# Patient Record
Sex: Female | Born: 1975 | Race: Black or African American | Hispanic: No | Smoking: Current every day smoker
Health system: Southern US, Community
[De-identification: ages and names within clinical notes are randomized; demographics above are authoritative.]

## PROBLEM LIST (undated history)

## (undated) ENCOUNTER — Inpatient Hospital Stay (HOSPITAL_COMMUNITY): Payer: Self-pay

## (undated) ENCOUNTER — Ambulatory Visit (HOSPITAL_COMMUNITY): Disposition: A | Discharge status: Home / Self Care | Payer: Medicaid Other

## (undated) DIAGNOSIS — I10 Essential (primary) hypertension: Secondary | ICD-10-CM

## (undated) DIAGNOSIS — F32A Depression, unspecified: Secondary | ICD-10-CM

## (undated) DIAGNOSIS — K219 Gastro-esophageal reflux disease without esophagitis: Secondary | ICD-10-CM

## (undated) DIAGNOSIS — N751 Abscess of Bartholin's gland: Secondary | ICD-10-CM

## (undated) DIAGNOSIS — O24419 Gestational diabetes mellitus in pregnancy, unspecified control: Secondary | ICD-10-CM

## (undated) DIAGNOSIS — J45909 Unspecified asthma, uncomplicated: Secondary | ICD-10-CM

## (undated) DIAGNOSIS — F329 Major depressive disorder, single episode, unspecified: Secondary | ICD-10-CM

## (undated) HISTORY — DX: Major depressive disorder, single episode, unspecified: F32.9

## (undated) HISTORY — DX: Depression, unspecified: F32.A

## (undated) HISTORY — DX: Gestational diabetes mellitus in pregnancy, unspecified control: O24.419

## (undated) HISTORY — DX: Abscess of Bartholin's gland: N75.1

## (undated) HISTORY — DX: Gastro-esophageal reflux disease without esophagitis: K21.9

## (undated) HISTORY — PX: WISDOM TOOTH EXTRACTION: SHX21

## (undated) HISTORY — DX: Unspecified asthma, uncomplicated: J45.909

---

## 1997-07-28 ENCOUNTER — Emergency Department (HOSPITAL_COMMUNITY): Admission: EM | Admit: 1997-07-28 | Discharge: 1997-07-28 | Payer: Self-pay | Admitting: Emergency Medicine

## 1997-10-28 ENCOUNTER — Encounter: Admission: RE | Admit: 1997-10-28 | Discharge: 1997-10-28 | Payer: Self-pay | Admitting: Family Medicine

## 1997-12-29 ENCOUNTER — Emergency Department (HOSPITAL_COMMUNITY): Admission: EM | Admit: 1997-12-29 | Discharge: 1997-12-29 | Payer: Self-pay | Admitting: *Deleted

## 1998-03-10 ENCOUNTER — Encounter: Admission: RE | Admit: 1998-03-10 | Discharge: 1998-03-10 | Payer: Self-pay | Admitting: Family Medicine

## 1998-04-17 ENCOUNTER — Encounter: Admission: RE | Admit: 1998-04-17 | Discharge: 1998-04-17 | Payer: Self-pay | Admitting: Sports Medicine

## 1998-04-26 ENCOUNTER — Encounter: Admission: RE | Admit: 1998-04-26 | Discharge: 1998-04-26 | Payer: Self-pay | Admitting: Family Medicine

## 1998-07-13 ENCOUNTER — Emergency Department (HOSPITAL_COMMUNITY): Admission: EM | Admit: 1998-07-13 | Discharge: 1998-07-13 | Payer: Self-pay | Admitting: Emergency Medicine

## 1998-07-29 ENCOUNTER — Emergency Department (HOSPITAL_COMMUNITY): Admission: EM | Admit: 1998-07-29 | Discharge: 1998-07-29 | Payer: Self-pay | Admitting: Emergency Medicine

## 1998-09-05 ENCOUNTER — Encounter: Payer: Self-pay | Admitting: Emergency Medicine

## 1998-09-05 ENCOUNTER — Emergency Department (HOSPITAL_COMMUNITY): Admission: EM | Admit: 1998-09-05 | Discharge: 1998-09-05 | Payer: Self-pay | Admitting: Emergency Medicine

## 1998-11-10 ENCOUNTER — Emergency Department (HOSPITAL_COMMUNITY): Admission: EM | Admit: 1998-11-10 | Discharge: 1998-11-10 | Payer: Self-pay | Admitting: Emergency Medicine

## 1999-03-01 ENCOUNTER — Encounter: Admission: RE | Admit: 1999-03-01 | Discharge: 1999-03-01 | Payer: Self-pay | Admitting: Family Medicine

## 1999-03-21 ENCOUNTER — Encounter: Admission: RE | Admit: 1999-03-21 | Discharge: 1999-03-21 | Payer: Self-pay | Admitting: Family Medicine

## 1999-04-04 ENCOUNTER — Encounter: Payer: Self-pay | Admitting: Emergency Medicine

## 1999-04-04 ENCOUNTER — Encounter: Admission: RE | Admit: 1999-04-04 | Discharge: 1999-04-04 | Payer: Self-pay | Admitting: Family Medicine

## 1999-04-04 ENCOUNTER — Emergency Department (HOSPITAL_COMMUNITY): Admission: EM | Admit: 1999-04-04 | Discharge: 1999-04-04 | Payer: Self-pay | Admitting: Emergency Medicine

## 1999-04-06 ENCOUNTER — Emergency Department (HOSPITAL_COMMUNITY): Admission: EM | Admit: 1999-04-06 | Discharge: 1999-04-06 | Payer: Self-pay | Admitting: Emergency Medicine

## 1999-04-11 ENCOUNTER — Encounter: Admission: RE | Admit: 1999-04-11 | Discharge: 1999-04-11 | Payer: Self-pay | Admitting: Family Medicine

## 1999-04-16 ENCOUNTER — Inpatient Hospital Stay (HOSPITAL_COMMUNITY): Admission: EM | Admit: 1999-04-16 | Discharge: 1999-04-19 | Payer: Self-pay | Admitting: Emergency Medicine

## 1999-05-23 ENCOUNTER — Encounter: Admission: RE | Admit: 1999-05-23 | Discharge: 1999-05-23 | Payer: Self-pay | Admitting: Family Medicine

## 1999-07-13 ENCOUNTER — Encounter: Payer: Self-pay | Admitting: Emergency Medicine

## 1999-07-13 ENCOUNTER — Inpatient Hospital Stay (HOSPITAL_COMMUNITY): Admission: AD | Admit: 1999-07-13 | Discharge: 1999-07-16 | Payer: Self-pay | Admitting: Obstetrics & Gynecology

## 1999-08-21 ENCOUNTER — Encounter: Admission: RE | Admit: 1999-08-21 | Discharge: 1999-08-21 | Payer: Self-pay | Admitting: Obstetrics & Gynecology

## 1999-08-21 ENCOUNTER — Other Ambulatory Visit: Admission: RE | Admit: 1999-08-21 | Discharge: 1999-08-21 | Payer: Self-pay | Admitting: Obstetrics & Gynecology

## 1999-09-27 ENCOUNTER — Encounter: Admission: RE | Admit: 1999-09-27 | Discharge: 1999-09-27 | Payer: Self-pay | Admitting: Family Medicine

## 1999-11-26 ENCOUNTER — Encounter: Admission: RE | Admit: 1999-11-26 | Discharge: 1999-11-26 | Payer: Self-pay | Admitting: Family Medicine

## 1999-12-28 ENCOUNTER — Encounter: Admission: RE | Admit: 1999-12-28 | Discharge: 1999-12-28 | Payer: Self-pay | Admitting: Family Medicine

## 2000-04-11 ENCOUNTER — Encounter: Admission: RE | Admit: 2000-04-11 | Discharge: 2000-04-11 | Payer: Self-pay | Admitting: Family Medicine

## 2000-04-14 ENCOUNTER — Encounter: Admission: RE | Admit: 2000-04-14 | Discharge: 2000-04-14 | Payer: Self-pay | Admitting: Family Medicine

## 2000-04-25 ENCOUNTER — Encounter: Admission: RE | Admit: 2000-04-25 | Discharge: 2000-04-25 | Payer: Self-pay | Admitting: Family Medicine

## 2000-09-17 ENCOUNTER — Encounter: Admission: RE | Admit: 2000-09-17 | Discharge: 2000-09-17 | Payer: Self-pay | Admitting: Family Medicine

## 2000-10-08 ENCOUNTER — Encounter: Admission: RE | Admit: 2000-10-08 | Discharge: 2000-10-08 | Payer: Self-pay | Admitting: Family Medicine

## 2001-02-20 ENCOUNTER — Other Ambulatory Visit: Admission: RE | Admit: 2001-02-20 | Discharge: 2001-02-20 | Payer: Self-pay | Admitting: Family Medicine

## 2001-02-20 ENCOUNTER — Encounter: Admission: RE | Admit: 2001-02-20 | Discharge: 2001-02-20 | Payer: Self-pay | Admitting: Family Medicine

## 2001-06-02 ENCOUNTER — Encounter: Admission: RE | Admit: 2001-06-02 | Discharge: 2001-06-02 | Payer: Self-pay | Admitting: Family Medicine

## 2001-06-10 ENCOUNTER — Encounter: Admission: RE | Admit: 2001-06-10 | Discharge: 2001-06-10 | Payer: Self-pay | Admitting: Family Medicine

## 2001-06-18 ENCOUNTER — Encounter: Admission: RE | Admit: 2001-06-18 | Discharge: 2001-06-18 | Payer: Self-pay | Admitting: Family Medicine

## 2001-06-30 ENCOUNTER — Encounter: Admission: RE | Admit: 2001-06-30 | Discharge: 2001-06-30 | Payer: Self-pay | Admitting: Family Medicine

## 2001-07-03 ENCOUNTER — Encounter: Admission: RE | Admit: 2001-07-03 | Discharge: 2001-07-03 | Payer: Self-pay | Admitting: Family Medicine

## 2001-07-29 ENCOUNTER — Encounter: Admission: RE | Admit: 2001-07-29 | Discharge: 2001-07-29 | Payer: Self-pay | Admitting: Family Medicine

## 2001-08-24 ENCOUNTER — Emergency Department (HOSPITAL_COMMUNITY): Admission: EM | Admit: 2001-08-24 | Discharge: 2001-08-24 | Payer: Self-pay

## 2001-09-10 ENCOUNTER — Encounter: Admission: RE | Admit: 2001-09-10 | Discharge: 2001-09-10 | Payer: Self-pay | Admitting: Family Medicine

## 2001-09-15 ENCOUNTER — Encounter: Admission: RE | Admit: 2001-09-15 | Discharge: 2001-09-15 | Payer: Self-pay | Admitting: Family Medicine

## 2002-08-10 ENCOUNTER — Encounter: Admission: RE | Admit: 2002-08-10 | Discharge: 2002-08-10 | Payer: Self-pay | Admitting: Sports Medicine

## 2002-11-03 ENCOUNTER — Encounter: Admission: RE | Admit: 2002-11-03 | Discharge: 2002-11-03 | Payer: Self-pay | Admitting: Family Medicine

## 2002-11-04 ENCOUNTER — Encounter: Payer: Self-pay | Admitting: Emergency Medicine

## 2002-11-04 ENCOUNTER — Emergency Department (HOSPITAL_COMMUNITY): Admission: EM | Admit: 2002-11-04 | Discharge: 2002-11-04 | Payer: Self-pay | Admitting: Emergency Medicine

## 2003-01-06 ENCOUNTER — Other Ambulatory Visit: Admission: RE | Admit: 2003-01-06 | Discharge: 2003-01-06 | Payer: Self-pay | Admitting: Family Medicine

## 2003-01-06 ENCOUNTER — Encounter: Admission: RE | Admit: 2003-01-06 | Discharge: 2003-01-06 | Payer: Self-pay | Admitting: Family Medicine

## 2003-05-20 ENCOUNTER — Emergency Department (HOSPITAL_COMMUNITY): Admission: EM | Admit: 2003-05-20 | Discharge: 2003-05-20 | Payer: Self-pay

## 2003-08-24 ENCOUNTER — Encounter: Admission: RE | Admit: 2003-08-24 | Discharge: 2003-08-24 | Payer: Self-pay | Admitting: Family Medicine

## 2003-09-18 ENCOUNTER — Inpatient Hospital Stay (HOSPITAL_COMMUNITY): Admission: AD | Admit: 2003-09-18 | Discharge: 2003-09-18 | Payer: Self-pay | Admitting: Obstetrics & Gynecology

## 2003-12-14 ENCOUNTER — Emergency Department (HOSPITAL_COMMUNITY): Admission: EM | Admit: 2003-12-14 | Discharge: 2003-12-14 | Payer: Self-pay

## 2003-12-26 ENCOUNTER — Encounter: Admission: RE | Admit: 2003-12-26 | Discharge: 2003-12-26 | Payer: Self-pay | Admitting: Family Medicine

## 2004-04-16 ENCOUNTER — Ambulatory Visit: Payer: Self-pay | Admitting: Sports Medicine

## 2004-05-15 ENCOUNTER — Ambulatory Visit: Payer: Self-pay | Admitting: Family Medicine

## 2004-05-15 ENCOUNTER — Other Ambulatory Visit: Admission: RE | Admit: 2004-05-15 | Discharge: 2004-05-15 | Payer: Self-pay | Admitting: Family Medicine

## 2004-05-21 ENCOUNTER — Ambulatory Visit: Payer: Self-pay | Admitting: Family Medicine

## 2004-06-06 ENCOUNTER — Ambulatory Visit: Payer: Self-pay | Admitting: Family Medicine

## 2004-06-18 ENCOUNTER — Inpatient Hospital Stay (HOSPITAL_COMMUNITY): Admission: EM | Admit: 2004-06-18 | Discharge: 2004-06-19 | Payer: Self-pay | Admitting: Emergency Medicine

## 2004-11-01 ENCOUNTER — Ambulatory Visit: Payer: Self-pay | Admitting: Family Medicine

## 2004-12-06 ENCOUNTER — Ambulatory Visit: Payer: Self-pay | Admitting: Family Medicine

## 2004-12-27 ENCOUNTER — Ambulatory Visit: Payer: Self-pay | Admitting: Family Medicine

## 2005-01-10 ENCOUNTER — Ambulatory Visit: Payer: Self-pay | Admitting: Family Medicine

## 2005-02-12 ENCOUNTER — Ambulatory Visit: Payer: Self-pay | Admitting: Family Medicine

## 2005-03-08 ENCOUNTER — Ambulatory Visit: Payer: Self-pay | Admitting: Family Medicine

## 2005-03-29 ENCOUNTER — Ambulatory Visit: Payer: Self-pay | Admitting: Family Medicine

## 2005-06-14 ENCOUNTER — Ambulatory Visit: Payer: Self-pay | Admitting: Sports Medicine

## 2005-07-28 ENCOUNTER — Encounter (INDEPENDENT_AMBULATORY_CARE_PROVIDER_SITE_OTHER): Payer: Self-pay | Admitting: *Deleted

## 2005-07-28 LAB — CONVERTED CEMR LAB

## 2005-07-31 ENCOUNTER — Ambulatory Visit: Payer: Self-pay | Admitting: Family Medicine

## 2005-07-31 ENCOUNTER — Encounter (INDEPENDENT_AMBULATORY_CARE_PROVIDER_SITE_OTHER): Payer: Self-pay | Admitting: Specialist

## 2005-08-08 ENCOUNTER — Ambulatory Visit: Payer: Self-pay | Admitting: Family Medicine

## 2005-09-03 ENCOUNTER — Ambulatory Visit: Payer: Self-pay | Admitting: Family Medicine

## 2005-12-02 ENCOUNTER — Ambulatory Visit: Payer: Self-pay | Admitting: Sports Medicine

## 2006-03-14 ENCOUNTER — Ambulatory Visit: Payer: Self-pay | Admitting: Family Medicine

## 2006-04-07 ENCOUNTER — Ambulatory Visit: Payer: Self-pay | Admitting: Family Medicine

## 2006-04-24 ENCOUNTER — Ambulatory Visit: Payer: Self-pay | Admitting: Family Medicine

## 2006-05-09 ENCOUNTER — Ambulatory Visit: Payer: Self-pay | Admitting: Family Medicine

## 2006-05-23 ENCOUNTER — Emergency Department (HOSPITAL_COMMUNITY): Admission: EM | Admit: 2006-05-23 | Discharge: 2006-05-23 | Payer: Self-pay | Admitting: Emergency Medicine

## 2006-06-26 DIAGNOSIS — K649 Unspecified hemorrhoids: Secondary | ICD-10-CM | POA: Insufficient documentation

## 2006-06-26 DIAGNOSIS — J309 Allergic rhinitis, unspecified: Secondary | ICD-10-CM | POA: Insufficient documentation

## 2006-06-26 DIAGNOSIS — J45909 Unspecified asthma, uncomplicated: Secondary | ICD-10-CM | POA: Insufficient documentation

## 2006-06-26 DIAGNOSIS — K219 Gastro-esophageal reflux disease without esophagitis: Secondary | ICD-10-CM

## 2006-06-26 DIAGNOSIS — F17200 Nicotine dependence, unspecified, uncomplicated: Secondary | ICD-10-CM

## 2006-06-26 DIAGNOSIS — O99210 Obesity complicating pregnancy, unspecified trimester: Secondary | ICD-10-CM | POA: Insufficient documentation

## 2006-06-27 ENCOUNTER — Encounter (INDEPENDENT_AMBULATORY_CARE_PROVIDER_SITE_OTHER): Payer: Self-pay | Admitting: *Deleted

## 2006-09-08 ENCOUNTER — Encounter (INDEPENDENT_AMBULATORY_CARE_PROVIDER_SITE_OTHER): Payer: Self-pay | Admitting: *Deleted

## 2006-09-08 ENCOUNTER — Other Ambulatory Visit: Admission: RE | Admit: 2006-09-08 | Discharge: 2006-09-08 | Payer: Self-pay | Admitting: Family Medicine

## 2006-09-08 ENCOUNTER — Ambulatory Visit: Payer: Self-pay | Admitting: Family Medicine

## 2006-09-08 DIAGNOSIS — Z8619 Personal history of other infectious and parasitic diseases: Secondary | ICD-10-CM

## 2006-09-08 LAB — CONVERTED CEMR LAB
BUN: 11 mg/dL (ref 6–23)
CO2: 27 meq/L (ref 19–32)
Chloride: 105 meq/L (ref 96–112)
Creatinine, Ser: 0.83 mg/dL (ref 0.40–1.20)
Glucose, Bld: 92 mg/dL (ref 70–99)
Hepatitis B Surface Ag: NEGATIVE
LDL Cholesterol: 77 mg/dL (ref 0–99)
VLDL: 12 mg/dL (ref 0–40)

## 2006-09-09 ENCOUNTER — Encounter (INDEPENDENT_AMBULATORY_CARE_PROVIDER_SITE_OTHER): Payer: Self-pay | Admitting: *Deleted

## 2006-09-11 ENCOUNTER — Encounter (INDEPENDENT_AMBULATORY_CARE_PROVIDER_SITE_OTHER): Payer: Self-pay | Admitting: *Deleted
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
## 2007-06-16 ENCOUNTER — Emergency Department (HOSPITAL_COMMUNITY): Admission: EM | Admit: 2007-06-16 | Discharge: 2007-06-16 | Payer: Self-pay | Admitting: Emergency Medicine

## 2007-11-05 ENCOUNTER — Emergency Department (HOSPITAL_COMMUNITY): Admission: EM | Admit: 2007-11-05 | Discharge: 2007-11-05 | Payer: Self-pay | Admitting: Emergency Medicine

## 2007-11-09 ENCOUNTER — Encounter: Payer: Self-pay | Admitting: *Deleted

## 2007-11-23 ENCOUNTER — Ambulatory Visit: Payer: Self-pay | Admitting: Family Medicine

## 2007-11-23 ENCOUNTER — Encounter: Payer: Self-pay | Admitting: Family Medicine

## 2007-11-23 DIAGNOSIS — F329 Major depressive disorder, single episode, unspecified: Secondary | ICD-10-CM

## 2007-11-23 DIAGNOSIS — I10 Essential (primary) hypertension: Secondary | ICD-10-CM

## 2007-11-23 LAB — CONVERTED CEMR LAB
ALT: 13 units/L (ref 0–35)
AST: 10 units/L (ref 0–37)
BUN: 11 mg/dL (ref 6–23)
Calcium: 9.6 mg/dL (ref 8.4–10.5)
Creatinine, Ser: 0.79 mg/dL (ref 0.40–1.20)
LDL Cholesterol: 73 mg/dL (ref 0–99)
Total Bilirubin: 0.3 mg/dL (ref 0.3–1.2)
Triglycerides: 137 mg/dL (ref <150)

## 2007-11-24 ENCOUNTER — Telehealth: Payer: Self-pay | Admitting: *Deleted

## 2007-11-30 ENCOUNTER — Ambulatory Visit (HOSPITAL_BASED_OUTPATIENT_CLINIC_OR_DEPARTMENT_OTHER): Admission: RE | Admit: 2007-11-30 | Discharge: 2007-11-30 | Payer: Self-pay | Admitting: Family Medicine

## 2007-12-05 ENCOUNTER — Encounter: Payer: Self-pay | Admitting: Family Medicine

## 2007-12-05 ENCOUNTER — Ambulatory Visit: Payer: Self-pay | Admitting: Internal Medicine

## 2007-12-08 ENCOUNTER — Ambulatory Visit: Payer: Self-pay | Admitting: Family Medicine

## 2007-12-08 ENCOUNTER — Encounter: Payer: Self-pay | Admitting: Family Medicine

## 2008-01-26 ENCOUNTER — Encounter: Payer: Self-pay | Admitting: *Deleted

## 2008-10-07 ENCOUNTER — Inpatient Hospital Stay (HOSPITAL_COMMUNITY): Admission: AD | Admit: 2008-10-07 | Discharge: 2008-10-07 | Payer: Self-pay | Admitting: Obstetrics & Gynecology

## 2009-02-28 ENCOUNTER — Ambulatory Visit: Payer: Self-pay | Admitting: Family Medicine

## 2009-03-30 ENCOUNTER — Ambulatory Visit: Payer: Self-pay | Admitting: Family Medicine

## 2009-03-30 ENCOUNTER — Encounter: Payer: Self-pay | Admitting: Family Medicine

## 2009-03-30 LAB — CONVERTED CEMR LAB
Chlamydia, DNA Probe: NEGATIVE
GC Probe Amp, Genital: NEGATIVE
HCV Ab: NEGATIVE
Whiff Test: NEGATIVE

## 2009-03-31 ENCOUNTER — Encounter: Payer: Self-pay | Admitting: Family Medicine

## 2009-05-23 ENCOUNTER — Telehealth: Payer: Self-pay | Admitting: *Deleted

## 2009-05-29 ENCOUNTER — Ambulatory Visit: Payer: Self-pay | Admitting: Family Medicine

## 2009-05-29 LAB — CONVERTED CEMR LAB: Beta hcg, urine, semiquantitative: NEGATIVE

## 2009-05-30 ENCOUNTER — Ambulatory Visit: Payer: Self-pay | Admitting: Family Medicine

## 2009-05-30 ENCOUNTER — Encounter: Payer: Self-pay | Admitting: Family Medicine

## 2009-05-31 LAB — CONVERTED CEMR LAB
ALT: 11 units/L (ref 0–35)
CO2: 27 meq/L (ref 19–32)
Calcium: 8.8 mg/dL (ref 8.4–10.5)
Chloride: 104 meq/L (ref 96–112)
Cholesterol: 126 mg/dL (ref 0–200)
Creatinine, Ser: 0.73 mg/dL (ref 0.40–1.20)
Sodium: 139 meq/L (ref 135–145)
Total Protein: 6.6 g/dL (ref 6.0–8.3)

## 2009-06-28 ENCOUNTER — Telehealth (INDEPENDENT_AMBULATORY_CARE_PROVIDER_SITE_OTHER): Payer: Self-pay | Admitting: Family Medicine

## 2009-06-28 ENCOUNTER — Ambulatory Visit: Payer: Self-pay | Admitting: Family Medicine

## 2009-06-29 ENCOUNTER — Encounter: Payer: Self-pay | Admitting: Family Medicine

## 2009-07-28 ENCOUNTER — Ambulatory Visit: Payer: Self-pay | Admitting: Family Medicine

## 2009-08-14 ENCOUNTER — Ambulatory Visit: Payer: Self-pay | Admitting: Family Medicine

## 2009-08-26 ENCOUNTER — Emergency Department (HOSPITAL_COMMUNITY): Admission: EM | Admit: 2009-08-26 | Discharge: 2009-08-26 | Payer: Self-pay | Admitting: Family Medicine

## 2009-10-25 ENCOUNTER — Ambulatory Visit: Payer: Self-pay | Admitting: Family Medicine

## 2009-10-27 ENCOUNTER — Ambulatory Visit: Payer: Self-pay | Admitting: Family Medicine

## 2009-12-04 ENCOUNTER — Ambulatory Visit: Payer: Self-pay | Admitting: Family Medicine

## 2009-12-12 ENCOUNTER — Telehealth (INDEPENDENT_AMBULATORY_CARE_PROVIDER_SITE_OTHER): Payer: Self-pay | Admitting: *Deleted

## 2009-12-27 ENCOUNTER — Encounter: Payer: Self-pay | Admitting: Family Medicine

## 2010-01-29 ENCOUNTER — Other Ambulatory Visit: Admission: RE | Admit: 2010-01-29 | Discharge: 2010-01-29 | Payer: Self-pay | Admitting: Family Medicine

## 2010-01-29 ENCOUNTER — Ambulatory Visit: Payer: Self-pay | Admitting: Family Medicine

## 2010-01-29 LAB — CONVERTED CEMR LAB: Pap Smear: NEGATIVE

## 2010-02-01 ENCOUNTER — Encounter: Payer: Self-pay | Admitting: Family Medicine

## 2010-02-20 ENCOUNTER — Ambulatory Visit: Payer: Self-pay | Admitting: Family Medicine

## 2010-04-02 ENCOUNTER — Encounter: Payer: Self-pay | Admitting: Family Medicine

## 2010-04-02 ENCOUNTER — Ambulatory Visit: Payer: Self-pay | Admitting: Family Medicine

## 2010-04-02 DIAGNOSIS — J069 Acute upper respiratory infection, unspecified: Secondary | ICD-10-CM

## 2010-05-03 ENCOUNTER — Ambulatory Visit: Admission: RE | Admit: 2010-05-03 | Discharge: 2010-05-03 | Payer: Self-pay | Source: Home / Self Care

## 2010-05-03 ENCOUNTER — Encounter
Admission: RE | Admit: 2010-05-03 | Discharge: 2010-05-03 | Payer: Self-pay | Source: Home / Self Care | Attending: Family Medicine | Admitting: Family Medicine

## 2010-05-03 ENCOUNTER — Encounter: Payer: Self-pay | Admitting: Family Medicine

## 2010-05-07 ENCOUNTER — Ambulatory Visit
Admission: RE | Admit: 2010-05-07 | Discharge: 2010-05-07 | Payer: Self-pay | Source: Home / Self Care | Attending: Family Medicine | Admitting: Family Medicine

## 2010-05-22 ENCOUNTER — Ambulatory Visit: Admit: 2010-05-22 | Payer: Self-pay

## 2010-05-22 ENCOUNTER — Ambulatory Visit
Admission: RE | Admit: 2010-05-22 | Discharge: 2010-05-22 | Payer: Self-pay | Source: Home / Self Care | Attending: Family Medicine | Admitting: Family Medicine

## 2010-05-31 NOTE — Assessment & Plan Note (Signed)
Summary: still concerned about chest congestion/bmc   Vital Signs:  Patient profile:   35 year old female Height:      61 inches Weight:      221 pounds BMI:     41.91 Temp:     98.1 degrees F oral Pulse rate:   90 / minute BP sitting:   138 / 91  (left arm) Cuff size:   large  Vitals Entered By: Tessie Fass CMA (May 07, 2010 3:38 PM) CC: f/u cough and congestion   Primary Care Provider:  Bobby Rumpf  MD  CC:  f/u cough and congestion.  History of Present Illness: Pt comes today to follow up her URI.  Overall she is improving. She is mostly bothered by a persistant cough. The cough medication works as night but it does make her feel kind of groggy.  She says that she is no longer feeling "sick". No fevers or chills.  Her job asked her to see the physician today for clearance prior to coming back to work.   Current Problems (verified): 1)  Upper Respiratory Infection, Viral  (ICD-465.9) 2)  Preventive Health Care  (ICD-V70.0) 3)  Screening For Malignant Neoplasm of The Cervix  (ICD-V76.2) 4)  Screening, Pulmonary Tuberculosis  (ICD-V74.1) 5)  Contraceptive Management  (ICD-V25.09) 6)  Essential Hypertension, Benign  (ICD-401.1) 7)  Depression, Major  (ICD-296.20) 8)  ? 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 Take One Tablet Each Day 2)  Wellbutrin 100 Mg  Tabs (Bupropion Hcl) .... Take One Pill Twice A Day 3)  Depo-Provera 150 Mg/ml Susp (Medroxyprogesterone Acetate) .Marland KitchenMarland KitchenMarland Kitchen 150 Mg Im Q59months 4)  Acyclovir 200 Mg Caps (Acyclovir) .... Two Tabs By Mouth Two Times A Day.  Disp One Month Supply 5)  Hydrocodone-Homatropine 5-1.5 Mg/22ml Syrp (Hydrocodone-Homatropine) .... Take 5ml By Mouth Every 6-8 Hours As Needed Cough (Mas 72ml/day  Allergies (verified): No Known Drug Allergies  Past History:  Past Medical History: Last updated: 11/23/2007 gonorrhea 4/07, hosp. 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    Physical Exam  General:  alert, well-hydrated, and obese, vitals reviewed.  Wearing mask Lungs:  CTABL nl WOB Heart:  normal rate, regular rhythm, no murmur, no gallop, and no rub.   Extremities:  no edema    Impression & Recommendations:  Problem # 1:  UPPER RESPIRATORY INFECTION, VIRAL (ICD-465.9) Assessment Improved  Doing well. Cleared Ms Pennix for work today. Gave red flags. See pt instructions.  Her updated medication list for this problem includes:    Hydrocodone-homatropine 5-1.5 Mg/11ml Syrp (Hydrocodone-homatropine) .Marland Kitchen... Take 5ml by mouth every 6-8 hours as needed cough (mas 50ml/day  Orders: [facility]- Est Level  3 (45409)  Complete Medication List: 1)  Lisinopril-hydrochlorothiazide 10-12.5 Mg Tabs (Lisinopril-hydrochlorothiazide) .... Take  one tablet each day 2)  Wellbutrin 100 Mg Tabs (Bupropion hcl) .... Take one pill twice a day 3)  Depo-provera 150 Mg/ml Susp (Medroxyprogesterone acetate) .Marland KitchenMarland KitchenMarland Kitchen 150 mg im q70months 4)  Acyclovir 200 Mg Caps (Acyclovir) .... Two tabs by mouth two times a day. disp one month supply 5)  Hydrocodone-homatropine 5-1.5 Mg/62ml Syrp (Hydrocodone-homatropine) .... Take 5ml by mouth every 6-8  hours as needed cough (mas 52ml/day  Patient Instructions: 1)  Thank you for seeing me today. 2)  If you have chest pain, difficulty breathing, fevers over 102 that does not get better with tylenol please call us or see a doctor.  3)  Your cough may last for weeks. If by mid to late Feburary you arnt feeling better come back.    Orders Added: 1)  [facility]- Est Level  3 [11914]

## 2010-05-31 NOTE — Letter (Signed)
Summary: Out of Work  [facility]  [address]   [place], [place] [address]   Phone: [phone]  Fax: [phone]    May 03, 2010   Employee:  Lemmie A PENNIX    To Whom It May Concern:   For Medical reasons, please excuse the above named employee from work for the following dates:  Start:   Monday the 04/30/10  End:     Monday the 05/07/10  If you need additional information, please feel free to contact our office. She has a contagious illness and should not be around co-workers or patients.    Sincerely,    Clementeen Graham MD

## 2010-05-31 NOTE — Letter (Signed)
Summary: Out of Work  [facility]  [address]   [place], [place] [address]   Phone: [phone]  Fax: [phone]    June 29, 2009   Employee:  Lindsay Keith    To Whom It May Concern:   For Medical reasons, please excuse the above named employee from work for the following dates:  Start: 06/28/09  Back to work 06/29/09    If you need additional information, please feel free to contact our office.         Sincerely,    Bobby Rumpf  MD

## 2010-05-31 NOTE — Progress Notes (Signed)
Summary: TB Test Req   Phone Note Call from Patient Call back at Home Phone [phone]   Caller: Patient Summary of Call: Needs copy of her tb test. Initial call taken by: Clydell Hakim,  December 12, 2009 8:49 AM  Follow-up for Phone Call        message left on voicemail that  PPD letter is ready to pick up. Follow-up by: Theresia Lo RN,  December 12, 2009 8:56 AM

## 2010-05-31 NOTE — Miscellaneous (Signed)
Summary: Asthma QI    Clinical Lists Changes  Problems: Changed problem from ASTHMA, UNSPECIFIED (ICD-493.90) to ASTHMA, INTERMITTENT (ICD-493.90) 

## 2010-05-31 NOTE — Letter (Signed)
Summary: Results Follow-up Letter  [facility]  [address]   [place], [place] [address]   Phone: [phone]  Fax: [phone]    02/01/2010  [address] [place], [place]  [address]  Dear Ms. PENNIX,   The following are the results of your recent test(s):  Test     Result     Pap Smear    Normal Sincerely,  Bobby Rumpf  MD [facility]           Appended Document: Results Follow-up Letter mailed.

## 2010-05-31 NOTE — Assessment & Plan Note (Signed)
Summary: depo,df    Nurse Visit   Allergies: No Known Drug Allergies Laboratory Results   Urine Tests  Date/Time Received: December 04, 2009 10:39 AM  Date/Time Reported: December 04, 2009 10:45 AM     Urine HCG: negative Comments: ...............test performed by......Marland KitchenBonnie A. [place], MLS (ASCP)cm     Medication Administration  Injection # 1:    Medication: Depo-Provera 150mg     Diagnosis: CONTRACEPTIVE MANAGEMENT (ICD-V25.09)    Route: IM    Site: LUOQ gluteus    Exp Date: 07/2011    Lot #: [id number]    Mfr: Pharmacia    Comments: next depo due Oct 24 thru Mar 05, 2010    Patient tolerated injection without complications    Given by: Theresia Lo RN (December 04, 2009 12:10 PM)  Orders Added: 1)  U Preg-[facility] [81025] 2)  Depo-Provera 150mg  [J1055] 3)  Admin of Injection (IM/SQ) [81191]    Medication Administration  Injection # 1:    Medication: Depo-Provera 150mg     Diagnosis: CONTRACEPTIVE MANAGEMENT (ICD-V25.09)    Route: IM    Site: LUOQ gluteus    Exp Date: 07/2011    Lot #: [id number]    Mfr: Pharmacia    Comments: next depo due Oct 24 thru Mar 05, 2010    Patient tolerated injection without complications    Given by: Theresia Lo RN (December 04, 2009 12:10 PM)  Orders Added: 1)  U Preg-[facility] [81025] 2)  Depo-Provera 150mg  [J1055] 3)  Admin of Injection (IM/SQ) [62130]   Appended Document: depo,df Medications Added DEPO-PROVERA 150 MG/ML SUSP (MEDROXYPROGESTERONE ACETATE) 150 mg IM q61months          Clinical Lists Changes  Medications: Added new medication of DEPO-PROVERA 150 MG/ML SUSP (MEDROXYPROGESTERONE ACETATE) 150 mg IM q35months

## 2010-05-31 NOTE — Assessment & Plan Note (Signed)
Summary: depo inj,tcb  patient in for depo today. explaine that she is too early for Depo today. not due until 014/18/2011. states she started having some vaginal bleeding 4 days ago. bleeding is light . she mostly notices it when she wipes after voiding. having some cramping also.  she thinks she needs to get depo now.   has only spotting on a pad.  advised this is not uncommon and also  consulted with Dr. Deirdre Priest.  patient voices understanding. advised if she wishes to discuss this with her PCP we can schedule appointment. Theresia Lo RN  July 28, 2009 4:41 PM  Nurse Visit   Allergies: No Known Drug Allergies  Orders Added: 1)  No Charge Patient Arrived (NCPA0) [NCPA0]

## 2010-05-31 NOTE — Assessment & Plan Note (Signed)
Summary: depo,tcb  patient reports after last visit she had bleeding for a total of 3 weeks , some light spotting and some more heavy which from how she describes sounds like a moderate amount. now bleeding has stopped completely. Dr.  [place] consulted and she advises still not uncommon for this to occur. advised patient to schedule appointment with PCP as it is time for her annual pap and check up.  Nurse Visit   Allergies: No Known Drug Allergies  Medication Administration  Injection # 1:    Medication: Depo-Provera 150mg     Diagnosis: CONTRACEPTIVE MANAGEMENT (ICD-V25.09)    Route: IM    Site: LUOQ gluteus    Exp Date: 08/2011    Lot #: [id number]    Mfr: [email or web address]    Comments: next depo due July 4 thru November 13, 2009    Patient tolerated injection without complications    Given by: Theresia Lo RN (August 14, 2009 3:25 PM)  Orders Added: 1)  Depo-Provera 150mg  [J1055] 2)  Admin of Injection (IM/SQ) [29528]   Medication Administration  Injection # 1:    Medication: Depo-Provera 150mg     Diagnosis: CONTRACEPTIVE MANAGEMENT (ICD-V25.09)    Route: IM    Site: LUOQ gluteus    Exp Date: 08/2011    Lot #: [id number]    Mfr: [email or web address]    Comments: next depo due July 4 thru November 13, 2009    Patient tolerated injection without complications    Given by: Theresia Lo RN (August 14, 2009 3:25 PM)  Orders Added: 1)  Depo-Provera 150mg  [J1055] 2)  Admin of Injection (IM/SQ) [01027]

## 2010-05-31 NOTE — Assessment & Plan Note (Signed)
Summary: depo,df   Nurse Visit   Allergies: No Known Drug Allergies  Medication Administration  Injection # 1:    Medication: Depo-Provera 150mg     Diagnosis: CONTRACEPTIVE MANAGEMENT (ICD-V25.09)    Route: IM    Site: RUOQ gluteus    Exp Date: 03/2012    Lot #: [id number]    Mfr: [email or web address]    Comments: next depo due Jan10 thru May 22, 2009    Patient tolerated injection without complications    Given by: Theresia Lo RN (February 20, 2010 9:51 AM)  Orders Added: 1)  Depo-Provera 150mg  [J1055] 2)  Admin of Injection (IM/SQ) [33295]   Medication Administration  Injection # 1:    Medication: Depo-Provera 150mg     Diagnosis: CONTRACEPTIVE MANAGEMENT (ICD-V25.09)    Route: IM    Site: RUOQ gluteus    Exp Date: 03/2012    Lot #: [id number]    Mfr: [email or web address]    Comments: next depo due Jan10 thru May 22, 2009    Patient tolerated injection without complications    Given by: Theresia Lo RN (February 20, 2010 9:51 AM)  Orders Added: 1)  Depo-Provera 150mg  [J1055] 2)  Admin of Injection (IM/SQ) [60630]

## 2010-05-31 NOTE — Assessment & Plan Note (Signed)
Summary: tb test,df   Nurse Visit   Allergies: No Known Drug Allergies  Immunizations Administered:  PPD Skin Test:    Vaccine Type: PPD    Site: right forearm    Mfr: Sanofi Pasteur    Dose: 0.1 ml    Given by: Theresia Lo RN    Exp. Date: 02/08/2011    Lot #: [id number]  Orders Added: 1)  TB Skin Test [86580] 2)  Admin 1st Vaccine [phone]

## 2010-05-31 NOTE — Assessment & Plan Note (Signed)
Summary: chest congestion/coughing/green phleghm/bmc   Vital Signs:  Patient profile:   35 year old female Height:      61 inches Weight:      220 pounds BMI:     41.72 Temp:     98.2 degrees F oral Pulse rate:   110 / minute BP sitting:   146 / 89  (left arm) Cuff size:   large  Vitals Entered By: Tessie Fass CMA (May 03, 2010 11:17 AM) CC: cough and congestion   Primary Care Provider:  Bobby Rumpf  MD  CC:  cough and congestion.  History of Present Illness: URI Symptoms Onset: 4 days Description: Cough congestion and phlegm Modifying factors: Recently has recoverd from another URI. She was completly well prior to this illness.    Symptoms Nasal discharge: Yes purulent Fever: to 101 yesterday Sore throat: No Cough: Yes Wheezing: Mild Ear pain: No GI symptoms: No Sick contacts: Yes  Red Flags  Stiff neck: No Dyspnea: No Rash: No Swallowing difficulty: No  Sinusitis Risk Factors Headache/face pain: No Double sickening: No tooth pain: No  Allergy Risk Factors Sneezing: No Itchy scratchy throat: No Seasonal symptoms: No  Flu Risk Factors Headache: No muscle aches: No severe fatigue: NO    Habits & Providers  Alcohol-Tobacco-Diet     Tobacco Status: current     Tobacco Counseling: to quit use of tobacco products     Cigarette Packs/Day: 0.25  Current Problems (verified): 1)  Upper Respiratory Infection, Viral  (ICD-465.9) 2)  Preventive Health Care  (ICD-V70.0) 3)  Screening For Malignant Neoplasm of The Cervix  (ICD-V76.2) 4)  Screening, Pulmonary Tuberculosis  (ICD-V74.1) 5)  Contraceptive Management  (ICD-V25.09) 6)  Essential Hypertension, Benign  (ICD-401.1) 7)  Depression, Major  (ICD-296.20) 8)  ? 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 Take One Tablet Each Day 2)  Wellbutrin 100 Mg  Tabs (Bupropion Hcl) .... Take One Pill Twice A Day 3)  Depo-Provera 150 Mg/ml Susp (Medroxyprogesterone Acetate) .Marland KitchenMarland KitchenMarland Kitchen 150 Mg Im Q82months 4)  Acyclovir 200 Mg Caps (Acyclovir) .... Two Tabs By Mouth Two Times A Day. Disp One Month Supply 5)  Hydrocodone-Homatropine 5-1.5 Mg/40ml Syrp (Hydrocodone-Homatropine) .... Take 5ml By Mouth Every 6-8 Hours As Needed Cough (Mas 65ml/day  Allergies (verified): No Known Drug Allergies  Past History:  Past Medical History: Last updated: 11/23/2007 gonorrhea 4/07, hosp. 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  The patient denies anorexia, weight loss, hoarseness, chest pain, syncope, dyspnea on exertion, abdominal pain, severe indigestion/heartburn, muscle weakness, difficulty walking, and abnormal bleeding.    Physical Exam  General:  alert, well-hydrated, and obese, vitals reviewed.  Wearing mask Mouth:  pharynx pink and moist, no erythema, and no exudates.   Lungs:  No WOB. Couging.  Some faint exp wheeze and crackles on right side. Normal on left.  Heart:  normal rate, regular rhythm, no murmur, no gallop, and no rub.   Abdomen:  Non distended, NABS, soft, nontender, no guarding or  rebound.   Extremities:  no edema  Cervical Nodes:  No lymphadenopathy noted Axillary Nodes:  No palpable lymphadenopathy   Impression & Recommendations:  Problem # 1:  UPPER RESPIRATORY INFECTION, VIRAL (ICD-465.9) Assessment New I feel this current illness is a new viral URI from the last one.  However she has existing lung disease and an abdnormal lung exam.  I feel a evaluation for pneumonia is warranted with a CXR.  If this is positive or she does not improve by monday or is worsening I will treat with antibiotics.  Will follow up as per instructions.    Her updated medication list for this problem includes:    Hydrocodone-homatropine 5-1.5 Mg/63ml Syrp (Hydrocodone-homatropine) .Marland Kitchen... Take 5ml by mouth every 6-8 hours as needed cough (mas 22ml/day  Orders: Diagnostic X-Ray/Fluoroscopy (Diagnostic X-Ray/Flu) [facility]- Est Level  3 (54627)  Problem # 2:  ESSENTIAL HYPERTENSION, BENIGN (ICD-401.1) Assessment: Deteriorated BP is elevated today. However she is ill. Plan to recheck with PCP in 1-2 weeks.   Her updated medication list for this problem includes:    Lisinopril-hydrochlorothiazide 10-12.5 Mg Tabs (Lisinopril-hydrochlorothiazide) .Marland Kitchen... Take one tablet each day  Complete Medication List: 1)  Lisinopril-hydrochlorothiazide 10-12.5 Mg Tabs (Lisinopril-hydrochlorothiazide) .... Take one tablet each day 2)  Wellbutrin 100 Mg Tabs (Bupropion hcl) .... Take one pill twice a day 3)  Depo-provera 150 Mg/ml Susp (Medroxyprogesterone acetate) .Marland KitchenMarland KitchenMarland Kitchen 150 mg im q46months 4)  Acyclovir 200 Mg Caps (Acyclovir) .... Two tabs by mouth two times a day. disp one month supply 5)  Hydrocodone-homatropine 5-1.5 Mg/55ml Syrp (Hydrocodone-homatropine) .... Take 5ml by mouth every 6-8 hours as needed cough (mas 38ml/day  Patient Instructions: 1)  Thank you for seeing me today. 2)  You can take the cough medicine every 4 hours or so as needed.  3)  I will call you if the X-ray needs treatment. 4)   Come back Monday if you arnt better.  5)  If you get worse come back here or go to urgent care or ER.  6)  If you have chest pain, difficulty breathing, fevers over 102 that does not get better with tylenol please call us or see a doctor.  7)  See Dr. Wallene Huh in 2 weeks for your blood pressure.    Orders Added: 1)  Diagnostic X-Ray/Fluoroscopy [Diagnostic X-Ray/Flu] 2)  [facility]- Est Level  3 [03500]

## 2010-05-31 NOTE — Progress Notes (Signed)
Summary: test results   Phone Note Call from Patient Call back at [phone]   Caller: Patient Summary of Call: wants to know results of tests done in Dec. Initial call taken by: De Nurse,  May 23, 2009 9:26 AM  Follow-up for Phone Call        gave her the results. she wants a copy. told her they will be at front for her to pick up Follow-up by: Golden Circle RN,  May 23, 2009 9:31 AM

## 2010-05-31 NOTE — Assessment & Plan Note (Signed)
Summary: cpe/pap,tcb   Vital Signs:  Patient profile:   35 year old female Height:      61 inches Weight:      219.5 pounds BMI:     41.62 Temp:     98.2 degrees F oral Pulse rate:   103 / minute BP sitting:   128 / 89  (left arm) Cuff size:   large  Vitals Entered By: Garen Grams LPN (January 29, 2010 10:15 AM) CC: cpp Is Patient Diabetic? No Pain Assessment Patient in pain? no        Primary Care Provider:  Bobby Rumpf  MD  CC:  cpp.  History of Present Illness: 1) Hypertension - 128/89 today, 140/97 at last visit. Taking medicationsas prescribed. Denies vision change, chest pain, dyspnea, LE edema, neurological symtpoms. Walks 3 times a week 1.5 miles per day. Has continued to eat more fruits and vegetables.   2) Prevention: Due for Pap.  3)  Depression - Improved mood since starting Wellbutrin 100 mg bid, but ran out  several months ago. Reports that her mood is better when on medication, though she has not had any tearful episodes (she does note that she does not get angry as easily). Also reported increased energy. Denies SI / HI. Denies anhedonia, poor concerntration, psychomotor sx. Patient still describes mood as "happy". Denies manic symptoms, though initially had some insomnia with starting the medication which resolved.   4) Tobacco abuse - reduced cravings w/ Wellbutrin, but ran out. Down from 1 pack q 2-3 days to pack every two weeks. 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 Unable to afford valtrex for suppressive therapy.    See prior meds for med rec  Habits & Providers  Alcohol-Tobacco-Diet     Tobacco Status: current     Tobacco Counseling: to quit use of tobacco products     Cigarette Packs/Day: 0.25  Medications Prior to Update: 1)  Lisinopril-Hydrochlorothiazide 10-12.5 Mg  Tabs (Lisinopril-Hydrochlorothiazide) .... Take One Tablet Each Day 2)  Wellbutrin 100 Mg  Tabs (Bupropion Hcl) .... Take One  Pill Twice A Day 3)  Depo-Provera 150 Mg/ml Susp (Medroxyprogesterone Acetate) .Marland KitchenMarland KitchenMarland Kitchen 150 Mg Im Q74months  Allergies (verified): No Known Drug Allergies  Family History: Reviewed history from 11/23/2007 and no changes required. 2 bros, 2 sis all well, F-asthma, DM, MGM-DM, breast ca, mult family members with CAD, M-well, PGM-DM, HTN   Social History: Reviewed history from 11/23/2007 and no changes required. 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  Eyes:  PERRL, EOMI, no conjunctivitis  Ears:  TMs clear bilaterally   Nose:  no congestion or rhinorrhea  Mouth:  moist membranes, no oral lesions.  Neck:  no thyromegaly  Lungs:  normal respiratory effort, no intercostal retractions, no accessory muscle use, normal breath sounds, no crackles, and no wheezes.   Heart:  normal rate, regular rhythm, no murmur, no gallop, and no rub.   Abdomen:  soft, obese, NT  Genitalia:  Pelvic Exam:        External: normal female genitalia without lesions or masses        Vagina: normal without lesions or masses        Cervix: normal without lesions or masses        Adnexa: normal bimanual exam without  masses or fullness        Uterus: normal by palpation        Pap smear: performed Pulses:  2+ radials  Extremities:  no edema  Neurologic:  alert & oriented X3, cranial nerves II-XII intact, strength normal in all extremities, and DTRs symmetrical and normal.   Skin:  no rash  Psych:  Oriented X3, memory intact for recent and remote, normally interactive, good eye contact, not anxious appearing, not depressed appearing, and not agitated.     Impression & Recommendations:  Problem # 1:  ESSENTIAL HYPERTENSION, BENIGN (ICD-401.1)  Well controlled. Continue medication as below. DASH diet reviewed.   Her updated medication list for this  problem includes:    Lisinopril-hydrochlorothiazide 10-12.5 Mg Tabs (Lisinopril-hydrochlorothiazide) .Marland Kitchen... 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 No active flares. Prescription for acyclovir for suppressive therapy given.   Problem # 3:  Preventive Health Care (ICD-V70.0)  Pap today. Reviewed diet and exercise recommendations.   Orders: [facility] - Est  18-39 yrs ([address]109)  Problem # 4:  TOBACCO DEPENDENCE (ICD-305.1) Actively trying to quit. Continue with Wellbutrin 200 qday. Counselled on health benefits.   Problem # 5:  DEPRESSION, MAJOR (ICD-296.20) Stable. Refilled wellbutrin. Patient to continue to identify means of helping improve mood and to implement safe and healthy behaviors.   Complete Medication List: 1)  Lisinopril-hydrochlorothiazide 10-12.5 Mg Tabs (Lisinopril-hydrochlorothiazide) .... Take one tablet each day 2)  Wellbutrin 100 Mg Tabs (Bupropion hcl) .... Take one pill twice a day 3)  Depo-provera 150 Mg/ml Susp (Medroxyprogesterone acetate) .Marland KitchenMarland KitchenMarland Kitchen 150 mg im q72months 4)  Acyclovir 200 Mg Caps (Acyclovir) .... Two tabs by mouth two times a day. disp one month supply  Other Orders: Pap Smear-[facility] ([id number])  Patient Instructions: 1)  It was great to see you today!  2)  Continue to exercise and try to increase if you can  3)  Follow up in 6 months for [place] to see how you are doing with your weight 4)  I will let you know about the sleep study 5)  I am GLAD to hear you are cutting back on smoking. Continue doing so until you QUIT!  Prescriptions: LISINOPRIL-HYDROCHLOROTHIAZIDE 10-12.5 MG  TABS (LISINOPRIL-HYDROCHLOROTHIAZIDE) Take one tablet  each day  #62 x 3   Entered and Authorized by:   Bobby Rumpf  MD   Signed by:   Bobby Rumpf  MD on 01/29/2010   Method used:   Electronically to        [facility] [phone]* (retail)       [address]       [place], [place]  [address]       Ph: [phone]       Fax: [phone]   RxID:   [id number] WELLBUTRIN 100 MG  TABS (BUPROPION HCL) Take one pill twice a day  #62 x 3   Entered and Authorized by:   Bobby Rumpf  MD   Signed by:   Bobby Rumpf  MD on 01/29/2010   Method used:   Electronically to        [facility] [phone]* (retail)  [address]       [place], [place]  [address]       Ph: [phone]       Fax: [phone]   RxID:   [id number] ACYCLOVIR 200 MG CAPS (ACYCLOVIR) two tabs by mouth two times a day. Disp one month supply  #120 x 6   Entered and Authorized by:   Bobby Rumpf  MD   Signed by:   Bobby Rumpf  MD on 01/29/2010   Method used:   Electronically to        [facility] [phone]* (retail)       [address]       [place], [place]  [address]       Ph: [phone]       Fax: [phone]   RxID:   [id number]   Appended Document: cpe/pap,tcb   Influenza Vaccine    Vaccine Type: Fluvax 3+    Site: left deltoid    Mfr: GlaxoSmithKline    Dose: 0.5 ml    Route: IM    Given by: Garen Grams LPN    Exp. Date: 10/24/2010    Lot #: [id number]    VIS given: 11/21/09 version given February 23, 2010.  Flu Vaccine Consent Questions    Do you have a history of severe allergic reactions to this vaccine? no    Any prior history of allergic reactions to egg and/or gelatin? no    Do you have a sensitivity to the preservative Thimersol? no    Do you have a past history of Guillan-Barre Syndrome? no    Do you currently have an acute febrile illness? no    Have you ever had a severe reaction to latex? no    Vaccine information given and explained to patient? yes    Are you currently pregnant? no

## 2010-05-31 NOTE — Letter (Signed)
Summary: Out of Work  [facility]  [address]   [place], [place] [address]   Phone: [phone]  Fax: [phone]    April 02, 2010   Employee:  Wynelle A PENNIX    To Whom It May Concern:   For Medical reasons, please excuse the above named employee from work for the following dates:  Start:   03-30-10  End:   04-03-10  If you need additional information, please feel free to contact our office.         Sincerely,    Alvia Grove DO

## 2010-05-31 NOTE — Assessment & Plan Note (Signed)
Summary: cold symptoms/eo   Vital Signs:  Patient profile:   35 year old female Height:      61 inches Weight:      221.50 pounds BMI:     42.00 BSA:     1.97 Temp:     98.0 degrees F Pulse rate:   111 / minute BP sitting:   133 / 95  Vitals Entered By: Jone Baseman CMA (April 02, 2010 4:21 PM) CC: cold symptoms 4 days Is Patient Diabetic? No Pain Assessment Patient in pain? no        Primary Care Provider:  Bobby Rumpf  MD  CC:  cold symptoms 4 days.  History of Present Illness: 35 yo female with a 3-4 day hx of cough, congestion, tactile fevers, sweats, chills, and a one day hx of nausea, vomitting, diarrhea.  No sick contacts.  No SOB.  Habits & Providers  Alcohol-Tobacco-Diet     Tobacco Status: current     Tobacco Counseling: to quit use of tobacco products     Cigarette Packs/Day: 0.25  Current Problems (verified): 1)  Upper Respiratory Infection, Viral  (ICD-465.9) 2)  Preventive Health Care  (ICD-V70.0) 3)  Screening For Malignant Neoplasm of The Cervix  (ICD-V76.2) 4)  Screening, Pulmonary Tuberculosis  (ICD-V74.1) 5)  Contraceptive Management  (ICD-V25.09) 6)  Essential Hypertension, Benign  (ICD-401.1) 7)  Depression, Major  (ICD-296.20) 8)  ? 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 Take One Tablet Each Day 2)  Wellbutrin 100 Mg  Tabs (Bupropion Hcl) .... Take One Pill Twice A Day 3)  Depo-Provera 150 Mg/ml Susp (Medroxyprogesterone Acetate) .Marland KitchenMarland KitchenMarland Kitchen 150 Mg Im Q15months 4)  Acyclovir 200 Mg Caps (Acyclovir) .... Two Tabs By Mouth Two Times A Day. Disp One  Month Supply 5)  Hydrocodone-Homatropine 5-1.5 Mg/39ml Syrp (Hydrocodone-Homatropine) .... Take 5ml By Mouth Every 6-8 Hours As Needed Cough (Mas 74ml/day  Allergies (verified): No Known Drug Allergies  Past History:  Past Medical History: Last updated: 11/23/2007 gonorrhea 4/07, hosp. 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  Ears:  External ear exam shows no significant lesions or deformities.  Otoscopic examination reveals clear canals, tympanic membranes are intact bilaterally without bulging, retraction, inflammation or discharge. Hearing is grossly normal bilaterally. Nose:  nasal dischargemucosal pallor.   Mouth:  pharynx pink and moist, no erythema, and no exudates.   Lungs:  normal respiratory effort, no intercostal retractions, no accessory muscle use, normal breath sounds, no crackles, and no wheezes.   Heart:  normal rate, regular rhythm, no murmur, no gallop, and no rub.   Abdomen:  soft, obese, NT  Neurologic:  alert &  oriented X3, cranial nerves II-XII intact, strength normal in all extremities, and DTRs symmetrical and normal.   Skin:  Intact without suspicious lesions or rashes   Impression & Recommendations:  Problem # 1:  UPPER RESPIRATORY INFECTION, VIRAL (ICD-465.9) symptomatic tx, RTC if no improvement in 5 days. red flags for return visit see pt instructions Her updated medication list for this problem includes:     Hydrocodone-homatropine 5-1.5 Mg/48ml Syrp (Hydrocodone-homatropine) .Marland Kitchen... Take 5ml by mouth every 6-8 hours as needed cough (mas 75ml/day  Orders: [facility]- Est Level  3 ([address]14)  Complete Medication List: 1)  Lisinopril-hydrochlorothiazide 10-12.5 Mg Tabs (Lisinopril-hydrochlorothiazide) .... Take one tablet each day 2)  Wellbutrin 100 Mg Tabs (Bupropion hcl) .... Take one pill twice a day 3)  Depo-provera 150 Mg/ml Susp (Medroxyprogesterone acetate) .Marland KitchenMarland KitchenMarland Kitchen 150 mg im q64months 4)  Acyclovir 200 Mg Caps (Acyclovir) .... Two tabs by mouth two times a day. disp one month supply 5)  Hydrocodone-homatropine 5-1.5 Mg/60ml Syrp (Hydrocodone-homatropine) .... Take 5ml by mouth every 6-8 hours as needed cough (mas 52ml/day  Patient Instructions: 1)  Take the cough medicine as we discussed.   2)  Make sure you continue to drink plenty of liquids. 3)  I hope you feel better! Prescriptions: HYDROCODONE-HOMATROPINE 5-1.5 MG/5ML SYRP (HYDROCODONE-HOMATROPINE) take 5mL by mouth every 6-8 hours as needed cough (mas 35mL/day  #331mL x 0   Entered and Authorized by:   Alvia Grove DO   Signed by:   Alvia Grove DO on 04/05/2010   Method used:   Print then Give to Patient   RxID:   [id number]    Orders Added: 1)  [facility]- Est Level  3 [[address]29]

## 2010-05-31 NOTE — Assessment & Plan Note (Signed)
Summary: depo/eo   Nurse Visit   Allergies: No Known Drug Allergies  Medication Administration  Injection # 1:    Medication: Depo-Provera 150mg     Diagnosis: CONTRACEPTIVE MANAGEMENT (ICD-V25.09)    Route: IM    Site: LUOQ gluteus    Exp Date: 08/2012    Lot #: [id number]    Mfr: Pharmacia    Comments: next depo due April11 thru  August 22, 2010    Patient tolerated injection without complications    Given by: Theresia Lo RN (May 22, 2010 3:00 PM)  Orders Added: 1)  Depo-Provera 150mg  [J1055] 2)  Admin of Injection (IM/SQ) [04540]   Medication Administration  Injection # 1:    Medication: Depo-Provera 150mg     Diagnosis: CONTRACEPTIVE MANAGEMENT (ICD-V25.09)    Route: IM    Site: LUOQ gluteus    Exp Date: 08/2012    Lot #: [id number]    Mfr: Pharmacia    Comments: next depo due April11 thru  August 22, 2010    Patient tolerated injection without complications    Given by: Theresia Lo RN (May 22, 2010 3:00 PM)  Orders Added: 1)  Depo-Provera 150mg  [J1055] 2)  Admin of Injection (IM/SQ) [47829]

## 2010-05-31 NOTE — Assessment & Plan Note (Signed)
Summary: READ PPD/KH   Nurse Visit   Allergies: No Known Drug Allergies  PPD Results    Date of reading: 10/27/2009    Results: 0 mm    Interpretation: negative  Orders Added: 1)  No Charge Patient Arrived (NCPA0) [NCPA0]

## 2010-05-31 NOTE — Assessment & Plan Note (Signed)
Summary: f/u,df   Vital Signs:  Patient profile:   35 year old female Height:      61 inches Weight:      228.4 pounds BMI:     43.31 Temp:     97.9 degrees F oral Pulse rate:   91 / minute BP sitting:   140 / 97  (left arm) Cuff size:   large  Vitals Entered By: Garen Grams LPN (May 29, 2009 10:59 AM) CC: wants to restart depo Is Patient Diabetic? No Pain Assessment Patient in pain? no        Primary Care Provider:  Bobby Rumpf  MD  CC:  wants to restart depo.  History of Present Illness: 1) Hypertension - 140/97 today. Has not taken medications today. Denies vision change, chest pain, dyspnea, LE edema, neurological symtpoms. Walks 3 times a week 30 minutes at a time. k. Has started to eat more fruits and vegetables.   2) Contraception - is here to restart Depo-Provera. Last had shot in August 2009. Usually has heavy periods with severe cramping. No contraindications to depo. LMP 05/07/09. Does not think she could be pregnanct.  3)  Depression - Improved mood since starting Wellbutrin 100 mg bid, but ran out about 1 month ago. Since then, has continued to have increased energy, better appetite. Denies SI / HI. Denies anhedonia, poor concerntration, psychomotor sx. Patient still describes mood as "happy". Denies manic symptoms, though initially had some insomnia with starting the medication which resolved with taking it in the morning.   4) Tobacco abuse - reduced cravings w/ Wellbutrin. Down from 1 pack q 2-3 days to pack a week. Contemplative about quitting completely  5) Hemorrhoids - h/o external hemorrhoids, has had a single external hemorrhoid for past few days with discomfort with BM. Denies constipation, melena, hematochezia. Has not tried anything for this. Stools alternate betwen hard and normal consistency   Habits & Providers  Alcohol-Tobacco-Diet     Tobacco Status: current     Cigarette Packs/Day: 0.25  Current Medications (verified): 1)   Lisinopril-Hydrochlorothiazide 10-12.5 Mg  Tabs (Lisinopril-Hydrochlorothiazide) .... Take One Tablet Each Day 2)  Wellbutrin 100 Mg  Tabs (Bupropion Hcl) .... Take One Pill Twice A Day  Allergies (verified): No Known Drug Allergies  Social History: Packs/Day:  0.25  Physical Exam  General:  alert, well-hydrated, and overweight-appearing.  vitals reviewed.  Neck:  no JVD  Lungs:  normal respiratory effort, no intercostal retractions, no accessory muscle use, normal breath sounds, no crackles, and no wheezes.   Heart:  normal rate, regular rhythm, no murmur, no gallop, and no rub.   Abdomen:  soft, obese, NT  Rectal:  single external hemorrhoid, non thrombosed, nno ulcerated, normal sphincter tone  Pulses:  2+ radials  Extremities:  no edema    Impression & Recommendations:  Problem # 1:  CONTRACEPTIVE MANAGEMENT (ICD-V25.09) Assessment Comment Only Upreg negative. Restart Depo. Return 3 months for repeat dose.  Orders: U Preg-[facility] ([address]) Depo-Provera 150mg  (J1055) [facility]- Est  Level 4 ([address]454)  Problem # 2:  ESSENTIAL HYPERTENSION, BENIGN (ICD-401.1) Assessment: Unchanged  Will check FLP, CMET. Continue meds. Likely at goal but deteriorated today due to not taking med this AM.  Her updated medication list for this problem includes:    Lisinopril-hydrochlorothiazide 10-12.5 Mg Tabs (Lisinopril-hydrochlorothiazide) .Marland Kitchen... Take one tablet each day  BP today: 140/97 Prior BP: 131/72 (03/30/2009)  Prior 10 Yr Risk Heart Disease: Not enough information (09/08/2006)  Labs Reviewed: K+: 4.5 (11/23/2007) Creat: :  0.79 (11/23/2007)   Chol: 131 (11/23/2007)   HDL: 31 (11/23/2007)   LDL: 73 (11/23/2007)   TG: 137 (11/23/2007)  Orders: [facility]- Est  Level 4 ([address])  Problem # 3:  TOBACCO DEPENDENCE (ICD-305.1)  Will work on trying to quit completely. Continue with Wellbutrin 200 qday. Counselled on health benefits.   Orders: [facility]- Est  Level 4 ([address]454)  Problem # 4:  DEPRESSION,  MAJOR (ICD-296.20) Assessment: Improved  Refilled wellbutrin. Patient to continue to identify means of helping improve mood and to implement safe and healthy behaviors.   Orders: [facility]- Est  Level 4 (09811)  Problem # 5:  OBESITY, NOS (ICD-278.00)  Discussed dietary changes, need to increase exercise.  Will check bmet, fasting lipid panel to furthur risk stratify.  Orders: [facility]- Est  Level 4 (99214)  Problem # 6:  HEMORRHOIDS, NOS (ICD-455.6) Preparation H for external hemorrhoid. Increase fiber, H2O intake.  Orders: [facility]- Est  Level 4 (91478)  Complete Medication List: 1)  Lisinopril-hydrochlorothiazide 10-12.5 Mg Tabs (Lisinopril-hydrochlorothiazide) .... Take one tablet each day 2)  Wellbutrin 100 Mg Tabs (Bupropion hcl) .... Take one pill twice a day  Patient Instructions: 1)  It was great to see you today!  2)  You can use Preparation H for your hemorrhoid as directed. 3)  Please try to increase the amount of walking you do to 30-45 minutes a day 4 times a week. 4)  I have refilled your medications. 5)  Follow up this week for fasting lab work (come in around 8:30 AM for a lab visit) 6)  Follow up with me in three months - we will talk about how your progress toward your weight loss goal.  7)  Try to stop smoking. 8)  Eat foods that are high in fiber, or take a daliy fiber supplement and drink 7-8 glasses of water a day to help prevent hemorrhoids). Avoid straining with bowel movements to help prevent hemorrhoids.  Prescriptions: WELLBUTRIN 100 MG  TABS (BUPROPION HCL) Take one pill twice a day  #62 x 3   Entered and Authorized by:   Bobby Rumpf  MD   Signed by:   Bobby Rumpf  MD on 05/29/2009   Method used:   Electronically to        [facility] [phone]* (retail)       [address]       [place], [place]  [address]       Ph: [phone]       Fax: [phone]   RxID:   [id number] LISINOPRIL-HYDROCHLOROTHIAZIDE 10-12.5 MG  TABS  (LISINOPRIL-HYDROCHLOROTHIAZIDE) Take one tablet each day  #62 x 3   Entered and Authorized by:   Bobby Rumpf  MD   Signed by:   Bobby Rumpf  MD on 05/29/2009   Method used:   Electronically to        [facility] [phone]* (retail)       [address]       [place], [place]  [address]       Ph: [phone]       Fax: [phone]   RxID:   [id number]   Laboratory Results   Urine Tests  Date/Time Received: May 29, 2009 11:00 AM  Date/Time Reported: May 29, 2009 11:09 AM     Urine HCG: negative Comments: ...........test performed by...........Marland KitchenTerese Door, CMA      Medication Administration  Injection # 1:    Medication: Depo-Provera 150mg     Diagnosis: CONTRACEPTIVE MANAGEMENT (  ICD-V25.09)    Route: IM    Site: RUOQ gluteus    Exp Date: 09/06/2011    Lot #: [id number]    Mfr: [email or web address]    Comments: Pt infomred to RTC 4.18.11-5.2.11 for next injection    Patient tolerated injection without complications    Given by: Jone Baseman CMA (May 29, 2009 11:40 AM)  Orders Added: 1)  U Preg-[facility] [81025] 2)  Depo-Provera 150mg  [J1055] 3)  [facility]- Est  Level 4 [04540]    Prevention & Chronic Care Immunizations   Influenza vaccine: Not documented    Tetanus booster: 03/29/2000: Done.   Tetanus booster due: 03/29/2010    Pneumococcal vaccine: Not documented  Other Screening   Pap smear: Done.  (07/28/2005)   Pap smear due: 07/29/2006   Smoking status: current  (05/29/2009)   Smoking cessation counseling: YES  (03/30/2009)  Hypertension   Last Blood Pressure: 140 / 97  (05/29/2009)   Serum creatinine: 0.79  (11/23/2007)   BMP action: Ordered   Serum potassium 4.5  (11/23/2007)   Basic metabolic panel due: 03/30/2009    Hypertension flowsheet reviewed?: Yes   Progress toward BP goal: Deteriorated  Self-Management Support :   Personal Goals (by the next clinic visit) :      Personal blood pressure goal: 140/90  (05/29/2009)   Patient  will work on the following items until the next clinic visit to reach self-care goals:     Medications and monitoring: take my medicines every day, check my blood pressure, bring all of my medications to every visit, weigh myself weekly  (05/29/2009)     Eating: drink diet soda or water instead of juice or soda, eat more vegetables, use fresh or frozen vegetables, eat baked foods instead of fried foods, eat fruit for snacks and desserts  (05/29/2009)     Activity: take a 30 minute walk every day  (02/28/2009)    Hypertension self-management support: Written self-care plan, Education handout  (05/29/2009)   Hypertension self-care plan printed.   Hypertension education handout printed

## 2010-05-31 NOTE — Progress Notes (Signed)
Summary: need note   Phone Note Call from Patient Call back at [phone]   Caller: Patient Summary of Call: pt is wanting a note to be able to go back to work tomorrow instead of tonight  needs asap today before she goes in tonight  Initial call taken by: De Nurse,  June 28, 2009 2:33 PM  Follow-up for Phone Call        Pt called back and wants to know if the date can be changed to today.  She is going back to work today before 3 todday. Follow-up by: Clydell Hakim,  June 29, 2009 1:34 PM  Additional Follow-up for Phone Call Additional follow up Details #1::        Pt notified that note will be ready to pick up. Additional Follow-up by: Clydell Hakim,  June 29, 2009 1:40 PM

## 2010-05-31 NOTE — Assessment & Plan Note (Signed)
Summary: bump on side of head,tcb   Vital Signs:  Patient profile:   35 year old female Height:      61 inches Weight:      228.1 pounds BMI:     43.25 Temp:     98.0 degrees F oral Pulse rate:   84 / minute BP sitting:   132 / 83  (right arm) Cuff size:   regular  Vitals Entered By: Garen Grams LPN (June 28, 6293 11:29 AM) CC: infected knot on side of head? Is Patient Diabetic? No Pain Assessment Patient in pain? no        Primary Care Provider:  Bobby Rumpf  MD  CC:  infected knot on side of head?.  History of Present Illness: 1) "Knot" on side of head. Appeared 1 week ago at left temple at hairline. Started as small pimple. Able to squeeze "white stuff" out at first. Now increased to size of dime, more painful with occasional pain down left side of face. . Denies fever, chills, nausea, emesis, worsening symptoms. No prior history of boils or MRSA.  Habits & Providers  Alcohol-Tobacco-Diet     Tobacco Status: current     Cigarette Packs/Day: 0.25  Current Medications (verified): 1)  Lisinopril-Hydrochlorothiazide 10-12.5 Mg  Tabs (Lisinopril-Hydrochlorothiazide) .... Take One Tablet Each Day 2)  Wellbutrin 100 Mg  Tabs (Bupropion Hcl) .... Take One Pill Twice A Day  Allergies (verified): No Known Drug Allergies  Physical Exam  General:  alert, well-hydrated, and overweight-appearing.  vitals reviewed.  Head:  single skin colored indurated papule w/ scab 1 cm x 1 cm at hairline near left temple w/o fluctuance or erythema.  Eyes:  PERRL, EOMI, no conjunctivitis  Ears:  single mobile pre-auricular node 3 mm x 3 mm on left  Mouth:  moist membranes, no oral lesions.    Impression & Recommendations:  Problem # 1:  ABSCESS, SKIN (ICD-682.9) Assessment New  Resolving per patient and based on exam. No signs of erythema, fluctuance or drainable abscess currently. No indication for antibiotics. Will follow. Advised warm compresses three times a day. Reviewed red  flags to return to care.   Orders: [facility]- Est Level  3 (28413)  Complete Medication List: 1)  Lisinopril-hydrochlorothiazide 10-12.5 Mg Tabs (Lisinopril-hydrochlorothiazide) .... Take one tablet each day 2)  Wellbutrin 100 Mg Tabs (Bupropion hcl) .... Take one pill twice a day  Patient Instructions: 1)  Apply warm compresses to the bump until it comes to a head.  2)  Take ibuprofen for pain 3)  Follow up if you develop fevers, chills, worsening pain or swelling or other concerns. 4)  Follow up at the end of April or early May as we discussed.

## 2010-08-06 LAB — POCT PREGNANCY, URINE: Preg Test, Ur: NEGATIVE

## 2010-08-06 LAB — CBC
Hemoglobin: 13.5 g/dL (ref 12.0–15.0)
RBC: 4.28 MIL/uL (ref 3.87–5.11)
WBC: 11.8 10*3/uL — ABNORMAL HIGH (ref 4.0–10.5)

## 2010-08-29 ENCOUNTER — Ambulatory Visit (INDEPENDENT_AMBULATORY_CARE_PROVIDER_SITE_OTHER): Payer: Medicaid Other | Admitting: *Deleted

## 2010-08-29 DIAGNOSIS — Z309 Encounter for contraceptive management, unspecified: Secondary | ICD-10-CM

## 2010-08-29 LAB — POCT URINE PREGNANCY: Preg Test, Ur: NEGATIVE

## 2010-08-29 MED ORDER — MEDROXYPROGESTERONE ACETATE 150 MG/ML IM SUSP
150.0000 mg | Freq: Once | INTRAMUSCULAR | Status: AC
  Administered 2010-08-29: 150 mg via INTRAMUSCULAR

## 2010-09-11 NOTE — Procedures (Signed)
NAMELATEIA, Lindsay Keith              ACCOUNT NO.:  [id number]   MEDICAL RECORD NO.:  [id number]          PATIENT TYPE:  OUT   LOCATION:  SLEEP CENTER                 FACILITY:  [facility]   PHYSICIAN:  Clinton D. Maple Hudson, MD, FCCP, FACPDATE OF BIRTH:  October 05, 1975   DATE OF STUDY:                            NOCTURNAL POLYSOMNOGRAM   REFERRING PHYSICIAN:  Bobby Rumpf, MD   INDICATION FOR STUDY:  Hypersomnia with sleep apnea.   EPWORTH SLEEPINESS SCORE:  10/24.  BMI 36.6.  Weight 200 pounds.  Height  62 inches.  Neck 13.5 inches.   MEDICATIONS:  Home medications charted and reviewed.   SLEEP ARCHITECTURE:  Total sleep time 293 minutes with sleep efficiency  76%.  Stage I 9.2%.  Stage II 79%.  Stage III absent.  REM 11.8% of  total sleep time.  Sleep latency 64 minutes.  REM latency 214 minutes.  Awake after sleep onset 28 minutes.  Arousal index 35.6.  No bedtime  medication was taken.   RESPIRATORY DATA:  Apnea/hypopnea index (AHI) 25.8 per hour.  Respiratory disturbance index (RDI) 40.1 per hour.  A total of 126  events were scored including 29 obstructive apneas and 97 hypopneas.  Events were not positional.  REM AHI 139.  Technician did not find  enough events in the first hours of the study to permit CPAP titration  by split protocol on this study night.   OXYGEN DATA:  Moderate snoring with oxygen desaturation to a nadir of  70%.  Mean oxygen saturation through the study was 95.6% on room air.  Ten minutes were recorded with oxygen saturation less than 88% while  asleep.   CARDIAC DATA:  Normal sinus rhythm.   MOVEMENT-PARASOMNIA:  Twenty limb jerks were scored including 12 meeting  criteria for periodic limb movement with arousal, index 2.5 per hour.   IMPRESSIONS-RECOMMENDATIONS:  1. Moderate obstructive sleep apnea/hypopnea syndrome, AHI 25.8 per      hour with nonpositional events.  Moderate snoring and oxygen      desaturation to a nadir of 70%.  2. Events developed too  late in the night to permit CPAP titration by      split protocol on this study night.  Consider      return for CPAP titration or evaluate for alternative management as      appropriate.  3. 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      Clinton D. Maple Hudson, MD, FCCP, FACP  Diplomate, Biomedical engineer of Sleep Medicine  Electronically Signed     CDY/MEDQ  D:  12/05/2007 11:47:03  T:  12/05/2007 13:04:20  Job:  [id number]

## 2010-09-14 NOTE — Discharge Summary (Signed)
[facility]  Patient:    Lindsay Keith, Lindsay Keith                     MRN: [id number] Adm. Date:  [id number] Disc. Date: [id number] Attending:  Antionette Char Dictator:   Nolon Nations, M.D.                           Discharge Summary  ADMISSION DIAGNOSES:          1. Acute abdominal pain.  DISCHARGE DIAGNOSES:          1. Tubo-ovarian abscess.  HISTORY OF PRESENT ILLNESS:   [name] is a 35 year old woman who noted the onset of pelvic pain and vaginal bleeding the night before admission.  The pain was severe and worsened over time.  She noted some heavy clots were passed the day before admission.  The patient was originally seen at [facility] and transferred to [facility] for workup for a potential tubo-ovarian abscess.  Her initial white count was 17.2.  HOSPITAL COURSE:              The patient was admitted and the CT and ultrasound were reviewed with radiologist to assess the diagnosis of tubo-ovarian abscess versus bilateral cysts.  Review of films indicated a tubo-ovarian abscess.  The  patient was started on clindamycin and gentamicin on admission IV.  Antibiotics  were changed to p.o. Doxycycline and Flagyl.  The patient was discharged home after being afebrile x 48 hours.  CONDITION ON DISCHARGE:       Good.  Discharged to home.  DISCHARGE MEDICATIONS:        1. Doxycycline 100 mg p.o. b.i.d. x 11 days.                               2. Flagyl 500 mg p.o. b.i.d. x 11 days.                               3. Motrin 400 mg q.4-6h. p.r.n. pain.                               4. Tylenol 325 mg q.4-6h. p.r.n. pain.  FOLLOW-UP:                    The patient was instructed to follow up in two weeks in the GYN Clinic. DD:  09/12/99 TD:  09/13/99 Job: [id number]

## 2010-09-14 NOTE — H&P (Signed)
Lindsay, Keith              ACCOUNT NO.:  [id number]   MEDICAL RECORD NO.:  [id number]          PATIENT TYPE:  EMS   LOCATION:  ED                           FACILITY:  [facility]   PHYSICIAN:  Mallory Shirk, MD     DATE OF BIRTH:  1975/10/30   DATE OF ADMISSION:  06/17/2004  DATE OF DISCHARGE:                                HISTORY & PHYSICAL   PRIMARY CARE PHYSICIAN:  Unassigned.   CHIEF COMPLAINT:  Shortness of breath x2 days, nausea and vomiting x4 days.   HISTORY OF PRESENT ILLNESS:  [name] is a pleasant 35 year old African  American woman with a history of asthma since childhood, who presented to  the emergency department of [facility] with complaints of shortness of  breath that was progressively worsening over the past 2 days.  The patient  states that she had a cough that started 4 days ago with posttussive nausea  and vomiting which got progressively worse.  The patient has been able to  eat and keep some food down; however, she does have this significant cough  and nausea and vomiting.  She has albuterol nebulizers at home; however,  this did not help and the patient decided to come into the ED.  She given  albuterol and Atrovent nebulizers; however, her shortness of breath did not  resolve and she was recommended for admission.   PAST MEDICAL HISTORY:  1.  Asthma.  2.  History of herpes infection.   MEDICATIONS:  1.  Albuterol inhalers.  2.  Valtrex 500 mg p.o. daily.   ALLERGIES:  No known drug allergies.   FAMILY HISTORY:  Noncontributory.   SOCIAL HISTORY:  The patient still smokes 1 pack per week, no alcohol or  drug use.   REVIEW OF SYSTEMS:  More than 10 systems reviewed, other than HIP, no  complaints.   PHYSICAL EXAM:  VITAL SIGNS:  Blood pressure 140/81, pulse 96, temperature  98.5, respirations 20.  GENERAL:  Obese young African American woman lying in bed in no acute  distress, drinking a can of soda pop, alert and oriented x3,  cooperative  with the exam.  HEENT:  Normocephalic, atraumatic; PERRLA, sclerae anicteric; mucous  membranes moist.  Oropharynx non-erythematous.  NECK:  Supple, no lymphadenopathy, no JVD.  LUNGS:  Bilateral wheezing in upper and lower lung fields, good air entry  bilaterally.  CARDIOVASCULAR:  S1 plus S2, tachycardic; no murmurs, rubs, or gallops.  ABDOMEN:  Soft; positive bowel sounds; no tenderness; no masses.  EXTREMITIES:  No cyanosis, clubbing or edema.  NEUROLOGIC:  Cranial nerves II-XII grossly intact.  Sensory and motor within  normal limits.  No focal deficits seen.   LABORATORY AND ACCESSORY CLINICAL DATA:  Chest x-ray:  No active disease.   LABORATORIES ON ADMISSION:  WBC is 19.5, hemoglobin 14.1, hematocrit 42.3,  platelets 374,000.  Sodium 138, potassium 3.8, chloride 105, carbon dioxide  29, glucose 87, BUN 6, creatinine 0.8, calcium 8.8, total protein 6.8,  albumin 3.5, AST 17, ALT 12, alkaline phosphatase 77, total bilirubin 0.7.  Urinalysis negative.  Urine pregnancy screen  negative.   ASSESSMENT AND PLAN:  [age]-year-old African American woman  presenting with asthma exacerbation likely secondary to an upper respiratory  infection.   1.  Asthma exacerbation:  The patient has been placed on albuterol and      Atrovent nebulizers q.4 h.  She was given a loading dose of Solu-Medrol      125 mg intravenously with subsequent Solu-Medrol at 60 mg intravenously      q.8 h.  She is also on oxygen by nasal cannula to saturations greater      than 93%.  Guaifenesin has been provided for cough.  2.  Upper respiratory infection:  WBC is 19.3, even though chest x-ray is      clear.  The patient has been started on Avelox 400 mg intravenously      daily.  Sputum cultures and blood cultures will be obtained.  She has      been given Cepacol for symptomatic relief of upper oropharyngeal      discomfort.  3.  Prophylaxis:  The patient has been given sequential compression  devices      for deep venous thrombosis prophylaxis, Protonix 40 mg p.o. daily for      gastrointestinal prophylaxis.  4.  Fluids and nutrition:  The patient will be given intravenous fluids,      normal saline at 100 mL/hr.  She has been placed on a regular diet.  Her      home medications have been resumed.      GDK/MEDQ  D:  06/18/2004  T:  06/18/2004  Job:  [id number]

## 2010-09-14 NOTE — Discharge Summary (Signed)
Lindsay Keith, Lindsay Keith              ACCOUNT NO.:  [id number]   MEDICAL RECORD NO.:  [id number]          PATIENT TYPE:  INP   LOCATION:  0478                         FACILITY:  [facility]   PHYSICIAN:  Gertha Calkin, M.D.DATE OF BIRTH:  Dec 19, 1975   DATE OF ADMISSION:  06/18/2004  DATE OF DISCHARGE:  06/19/2004                                 DISCHARGE SUMMARY   DISCHARGE DIAGNOSES:  1.  Asthma exacerbation.  2.  History of herpes infection.  3.  Tobacco abuse.   DISCHARGE MEDICATIONS:  Prednisone Dosepak taper.   HOSPITAL COURSE:  Please see H&P for details on admission.  1.  Asthma exacerbation.  Started on steroids, breathing treatments and      empiric antibiotics.  Her symptoms were relieved shortly after the first      treatment.  On the day of discharge, she is stable with good O2 sats.      No wheezing on lung exam.  Will be discharged home on a prednisone      taper.  Antibiotics have been discharged, and she does not need to      continue these at discharge.  2.  Tobacco abuse.  Counseled personally during this hospitalization.  She      states that she is willing to quit, and I have given her suggestions on      over-the-counter nicotine patches as well as nicotine gum.  3.  History of HSV.  To continue her Valtrex daily dose as before.   DISPOSITION:  Patient tolerating p.o. diet, not having any breathing issues.  Will be sent home on her taper as noted above.  She does not need to  continue antibiotics and will just need to follow up with her primary care  physician.  No immediate concerns at the time of discharge.      JD/MEDQ  D:  06/19/2004  T:  06/19/2004  Job:  [id number]

## 2010-11-28 ENCOUNTER — Ambulatory Visit (INDEPENDENT_AMBULATORY_CARE_PROVIDER_SITE_OTHER): Payer: Medicaid Other | Admitting: *Deleted

## 2010-11-28 DIAGNOSIS — Z309 Encounter for contraceptive management, unspecified: Secondary | ICD-10-CM

## 2010-11-28 MED ORDER — MEDROXYPROGESTERONE ACETATE 150 MG/ML IM SUSP
150.0000 mg | Freq: Once | INTRAMUSCULAR | Status: AC
  Administered 2010-11-28: 150 mg via INTRAMUSCULAR

## 2010-11-28 NOTE — Progress Notes (Signed)
error 

## 2011-01-21 LAB — POCT PREGNANCY, URINE
Operator id: 231701
Preg Test, Ur: NEGATIVE

## 2011-01-21 LAB — URINALYSIS, ROUTINE W REFLEX MICROSCOPIC
Ketones, ur: >80 — AB
Leukocytes, UA: NEGATIVE
Nitrite: NEGATIVE
Specific Gravity, Urine: 1.034 — ABNORMAL HIGH
pH: 6

## 2011-01-21 LAB — BASIC METABOLIC PANEL
BUN: 8
Creatinine, Ser: 0.89
GFR calc non Af Amer: >60

## 2011-01-21 LAB — URINE MICROSCOPIC-ADD ON

## 2011-02-06 ENCOUNTER — Emergency Department (HOSPITAL_COMMUNITY)
Admission: EM | Admit: 2011-02-06 | Discharge: 2011-02-06 | Disposition: A | Discharge status: Home / Self Care | Payer: PRIVATE HEALTH INSURANCE | Attending: Emergency Medicine | Admitting: Emergency Medicine

## 2011-02-06 DIAGNOSIS — L259 Unspecified contact dermatitis, unspecified cause: Secondary | ICD-10-CM | POA: Insufficient documentation

## 2011-02-06 DIAGNOSIS — I10 Essential (primary) hypertension: Secondary | ICD-10-CM | POA: Insufficient documentation

## 2011-02-06 DIAGNOSIS — R599 Enlarged lymph nodes, unspecified: Secondary | ICD-10-CM | POA: Insufficient documentation

## 2011-02-26 ENCOUNTER — Ambulatory Visit (INDEPENDENT_AMBULATORY_CARE_PROVIDER_SITE_OTHER): Payer: Medicaid Other | Admitting: Family Medicine

## 2011-02-26 ENCOUNTER — Encounter: Payer: Self-pay | Admitting: Family Medicine

## 2011-02-26 DIAGNOSIS — I10 Essential (primary) hypertension: Secondary | ICD-10-CM

## 2011-02-26 DIAGNOSIS — N751 Abscess of Bartholin's gland: Secondary | ICD-10-CM

## 2011-02-26 NOTE — Patient Instructions (Signed)
Nice to meet you. Keep wet compresses on area at least daily to prevent recurrence. Call for appointment if pain returns.  Make an appointment with Dr. Konrad Dolores to discuss blood pressure and pregnancy if you wish to have another baby.

## 2011-02-27 DIAGNOSIS — N751 Abscess of Bartholin's gland: Secondary | ICD-10-CM

## 2011-02-27 HISTORY — DX: Abscess of Bartholin's gland: N75.1

## 2011-02-27 NOTE — Progress Notes (Signed)
  Subjective:    Patient ID: Lindsay Keith, female    DOB: 04-Aug-1975, 35 y.o.   MRN: [id number]  HPI  1. Nodule on labia. Patient c/o nodule present on right labia since earlier this year. Was initially the size of a pencil tip and now has grown over past 2 weeks. Used a mirror to see the nodule. Never had a lesion like this before. Causing significant irritation and itching. Denies pain, bleeding, oozing, ulcers, rash, discharge.   2. BP. Noted high BP today. Does not check at home. Denies vision changes, CP, SOB, edema.   3. Desires pregnancy. Has one 34 yo child and considering another baby.   Review of Systems See HPI otherwise negative.     Objective:   Physical Exam  Vitals reviewed. Constitutional: She is oriented to person, place, and time. She appears well-developed and well-nourished. No distress.  HENT:  Head: Normocephalic and atraumatic.  Cardiovascular: Normal rate, regular rhythm and normal heart sounds.   No murmur heard. Pulmonary/Chest: Effort normal and breath sounds normal. No respiratory distress. She has no wheezes. She has no rales.  Musculoskeletal: She exhibits no edema.  Neurological: She is alert and oriented to person, place, and time.  Skin:       0.5cm mobile cyst in 7 o clock region of inner side labia majora. Quite tender to the touch.  No tract, bleeding, rash or ulcer.           Assessment & Plan:

## 2011-02-27 NOTE — Assessment & Plan Note (Signed)
Small cyst/abscess irritated and present for several months. Decided to incise and drain today. Recommended placement of wet compresses at least daily to prevent recurrence. Cover with gauze and bacitracin daily until healed. Follow up if recurs or pain worsens. PATP.  Informed consent obtained and time out for procedure. Cleaned site with betadine.  Injected 3cc 1% xylo w/ epi posterior to cyst. #11 blade used to incise superficially. Moderate amount pus and serosanguanous material returned. No palpable abscess palpated after procedure. Hemostasis achieved, covered with bacitracin and gauze dressing.

## 2011-02-27 NOTE — Assessment & Plan Note (Signed)
BP significantly elevated in clinic. Had not taken her medication today. Advised to return for BP follow up in next week and to take home medication today. Also discussed need to avoid pregnancy if taking her ACEi until she can follow up with PCP for medication management.

## 2011-03-03 ENCOUNTER — Other Ambulatory Visit: Payer: Self-pay | Admitting: Family Medicine

## 2011-03-03 DIAGNOSIS — A6000 Herpesviral infection of urogenital system, unspecified: Secondary | ICD-10-CM

## 2011-03-03 DIAGNOSIS — I10 Essential (primary) hypertension: Secondary | ICD-10-CM

## 2011-04-11 ENCOUNTER — Ambulatory Visit (INDEPENDENT_AMBULATORY_CARE_PROVIDER_SITE_OTHER): Payer: Medicaid Other | Admitting: *Deleted

## 2011-04-11 DIAGNOSIS — Z309 Encounter for contraceptive management, unspecified: Secondary | ICD-10-CM

## 2011-04-11 LAB — POCT URINE PREGNANCY: Preg Test, Ur: NEGATIVE

## 2011-04-11 MED ORDER — MEDROXYPROGESTERONE ACETATE 150 MG/ML IM SUSP
150.0000 mg | Freq: Once | INTRAMUSCULAR | Status: AC
  Administered 2011-04-11: 150 mg via INTRAMUSCULAR

## 2011-04-11 NOTE — Progress Notes (Signed)
Advised patient to schedule appointment for CPE and PAP .

## 2011-05-08 ENCOUNTER — Encounter: Payer: Self-pay | Admitting: Family Medicine

## 2011-05-08 ENCOUNTER — Ambulatory Visit (INDEPENDENT_AMBULATORY_CARE_PROVIDER_SITE_OTHER): Payer: Medicaid Other | Admitting: Family Medicine

## 2011-05-08 VITALS — BP 145/119 | HR 114 | Temp 100.5°F | Ht 61.0 in | Wt 210.0 lb

## 2011-05-08 DIAGNOSIS — J111 Influenza due to unidentified influenza virus with other respiratory manifestations: Secondary | ICD-10-CM

## 2011-05-08 DIAGNOSIS — R6889 Other general symptoms and signs: Secondary | ICD-10-CM

## 2011-05-08 MED ORDER — PROMETHAZINE HCL 12.5 MG PO TABS: 12.5000 mg | ORAL_TABLET | Freq: Three times a day (TID) | ORAL | Status: AC | PRN

## 2011-05-08 MED ORDER — HYDROCODONE-HOMATROPINE 5-1.5 MG/5ML PO SYRP: 5.0000 mL | ORAL_SOLUTION | Freq: Three times a day (TID) | ORAL | Status: DC | PRN

## 2011-05-08 NOTE — Progress Notes (Signed)
[name] is a 36 year old woman who presents with 5 days of body aches decreased appetite vomiting dizziness and cough. She has multiple sick contacts with influenza. She denies any dyspnea or inability to eat. Additionally she denies any chest pains or significant headache.  PMH reviewed.  ROS as above otherwise neg Medications reviewed. Current Outpatient Prescriptions  Medication Sig Dispense Refill  . acyclovir (ZOVIRAX) 200 MG capsule TAKE TWO CAPSULES BY MOUTH TWICE DAILY  120 capsule  2  . buPROPion (WELLBUTRIN) 100 MG tablet Take 100 mg by mouth 2 (two) times daily.        Marland Kitchen HYDROcodone-homatropine (HYCODAN) 5-1.5 MG/5ML syrup Take 5 mLs by mouth every 8 (eight) hours as needed for cough.  120 mL  0  . lisinopril-hydrochlorothiazide (PRINZIDE,ZESTORETIC) 10-12.5 MG per tablet TAKE ONE TABLET BY MOUTH EVERY DAY  30 tablet  2  . medroxyPROGESTERone (DEPO-PROVERA) 150 MG/ML injection Inject 150 mg into the muscle every 3 (three) months.        . promethazine (PHENERGAN) 12.5 MG tablet Take 1 tablet (12.5 mg total) by mouth every 8 (eight) hours as needed for nausea.  20 tablet  0  . DISCONTD: HYDROcodone-homatropine (HYCODAN) 5-1.5 MG/5ML syrup Take 5 mLs by mouth. Every 6-8 hours as needed.         Exam:  BP 145/119  Pulse 114  Temp(Src) 100.5 F (38.1 C) (Oral)  Ht 5\' 1"  (1.549 m)  Wt 210 lb (95.255 kg)  BMI 39.68 kg/m2 Gen: Well NAD HEENT: EOMI,  MMM Lungs: CTABL Nl WOB Heart: RRR no MRG Abd: NABS, NT, ND Exts: Non edematous BL  LE, warm and well perfused.

## 2011-05-08 NOTE — Assessment & Plan Note (Signed)
The appendix has influenza-like illness. She is outside of the treatment window for Tamiflu. Recommend symptomatic management. Will prescribe Hycodan cough syrup for cough and Phenergan as needed for nausea. Additionally recommend Tylenol. Recommend that she followup with her primary care doctor in a few days to discuss her elevated blood pressure. Red flags reviewed patient expresses understanding

## 2013-01-25 ENCOUNTER — Ambulatory Visit: Payer: PRIVATE HEALTH INSURANCE | Admitting: Family Medicine

## 2014-03-01 ENCOUNTER — Ambulatory Visit: Payer: PRIVATE HEALTH INSURANCE | Admitting: Family Medicine

## 2014-03-18 ENCOUNTER — Encounter: Payer: PRIVATE HEALTH INSURANCE | Admitting: Family Medicine

## 2014-04-08 ENCOUNTER — Other Ambulatory Visit (HOSPITAL_COMMUNITY)
Admission: RE | Admit: 2014-04-08 | Discharge: 2014-04-08 | Disposition: A | Discharge status: Home / Self Care | Payer: Managed Care, Other (non HMO) | Source: Ambulatory Visit | Attending: Family Medicine | Admitting: Family Medicine

## 2014-04-08 ENCOUNTER — Telehealth: Payer: Self-pay | Admitting: Family Medicine

## 2014-04-08 ENCOUNTER — Encounter: Payer: Self-pay | Admitting: Family Medicine

## 2014-04-08 ENCOUNTER — Ambulatory Visit (INDEPENDENT_AMBULATORY_CARE_PROVIDER_SITE_OTHER): Payer: Managed Care, Other (non HMO) | Admitting: Family Medicine

## 2014-04-08 VITALS — BP 179/113 | HR 100 | Ht 61.0 in | Wt 217.0 lb

## 2014-04-08 DIAGNOSIS — N898 Other specified noninflammatory disorders of vagina: Secondary | ICD-10-CM

## 2014-04-08 DIAGNOSIS — A6000 Herpesviral infection of urogenital system, unspecified: Secondary | ICD-10-CM

## 2014-04-08 DIAGNOSIS — I10 Essential (primary) hypertension: Secondary | ICD-10-CM

## 2014-04-08 DIAGNOSIS — Z124 Encounter for screening for malignant neoplasm of cervix: Secondary | ICD-10-CM

## 2014-04-08 DIAGNOSIS — Z113 Encounter for screening for infections with a predominantly sexual mode of transmission: Secondary | ICD-10-CM | POA: Diagnosis present

## 2014-04-08 DIAGNOSIS — Z1151 Encounter for screening for human papillomavirus (HPV): Secondary | ICD-10-CM | POA: Diagnosis present

## 2014-04-08 DIAGNOSIS — Z01419 Encounter for gynecological examination (general) (routine) without abnormal findings: Secondary | ICD-10-CM | POA: Insufficient documentation

## 2014-04-08 MED ORDER — ACYCLOVIR 200 MG PO CAPS: 400.0000 mg | ORAL_CAPSULE | Freq: Three times a day (TID) | ORAL | Status: DC

## 2014-04-08 MED ORDER — LISINOPRIL-HYDROCHLOROTHIAZIDE 10-12.5 MG PO TABS: 1.0000 | ORAL_TABLET | Freq: Every day | ORAL | Status: DC

## 2014-04-08 NOTE — Progress Notes (Signed)
Patient ID: Lindsay Keith, female   DOB: Oct 30, 1975, 38 y.o.   MRN: [id number]   HPI  Patient presents today for concern for herpes flare  Patient states that she's had several flares before. She only has 1-2 flares per year.  She states that she began having irritation and pain similar to previous herpes outbreaks about a week ago. She states that she cannot see any blisters but would like exam today.  She states that she's not sexually active, no sexual partners in the last 6 months. Denies any vaginal irritation, itching, discharge, or abdominal pain. She denies any history of abnormal Pap smears.  He has high blood pressure today She states that she thinks it might be elevated due to her being out of medicine for about a month as well as being anxious about today. Denies chest pain, headaches, palpitations, leg edema, and dyspnea.  Smoking status noted ROS: Per HPI  Objective: BP 179/113 mmHg  Pulse 100  Ht 5\' 1"  (1.549 m)  Wt 217 lb (98.431 kg)  BMI 41.02 kg/m2  LMP 03/30/2014 Gen: NAD, alert, cooperative with exam HEENT: NCAT, EOMI, PERRL CV: RRR, good S1/S2, no murmur Resp: CTABL, no wheezes, non-labored Abd: SNTND, BS present, no guarding or organomegaly Ext: No edema, warm Neuro: Alert and oriented, No gross deficits GU: Normal-appearing cervix, vaginal walls pink and well rugated, scant white discharge, no cervical motion tenderness, or adnexal fullness or tenderness.  Assessment and plan:  Genital herpes Mild flare Treat with Acyclovir Pap and GCC today as well Given refills and directions for 2 more flares.      Meds ordered this encounter  Medications  . acyclovir (ZOVIRAX) 200 MG capsule    Sig: Take 2 capsules (400 mg total) by mouth 3 (three) times daily.    Dispense:  30 capsule    Refill:  2  . lisinopril-hydrochlorothiazide (PRINZIDE,ZESTORETIC) 10-12.5 MG per tablet    Sig: Take 1 tablet by mouth daily.    Dispense:  30 tablet    Refill:   2

## 2014-04-08 NOTE — Patient Instructions (Signed)
Great to meet you!  We will contact you with the results of this pap smear, we may have to do it again  Take the acyclovir 2 pills 3 times daily for 5 days, You have 2 refills to take at the first sign of an outbreak.

## 2014-04-08 NOTE — Assessment & Plan Note (Addendum)
Mild flare Treat with Acyclovir Pap and GCC today as well Given refills and directions for 2 more flares.

## 2014-04-12 LAB — CYTOLOGY - PAP

## 2014-04-12 LAB — CERVICOVAGINAL ANCILLARY ONLY
CHLAMYDIA, DNA PROBE: NEGATIVE
NEISSERIA GONORRHEA: NEGATIVE

## 2014-04-15 ENCOUNTER — Encounter: Payer: Self-pay | Admitting: Family Medicine

## 2014-04-15 ENCOUNTER — Telehealth: Payer: Self-pay | Admitting: Family Medicine

## 2014-04-15 NOTE — Telephone Encounter (Signed)
LVM for patient to call back. ?

## 2014-04-15 NOTE — Telephone Encounter (Signed)
Gcc negative, Pap inadequate as I was suspicious may be the case as cervical view was lost prior to sample.   Will ask nursing to inform.   Murtis SinkSam Bradshaw, MD [facility] Resident, PGY-3 04/15/2014, 10:31 AM

## 2015-02-26 ENCOUNTER — Other Ambulatory Visit: Payer: Self-pay | Admitting: Family Medicine

## 2015-03-01 ENCOUNTER — Other Ambulatory Visit: Payer: Self-pay | Admitting: Family Medicine

## 2016-06-04 ENCOUNTER — Emergency Department (HOSPITAL_COMMUNITY): Payer: Self-pay

## 2016-06-04 ENCOUNTER — Encounter (HOSPITAL_COMMUNITY): Payer: Self-pay | Admitting: Emergency Medicine

## 2016-06-04 ENCOUNTER — Emergency Department (HOSPITAL_COMMUNITY)
Admission: EM | Admit: 2016-06-04 | Discharge: 2016-06-04 | Disposition: A | Discharge status: Home / Self Care | Payer: Self-pay | Attending: Emergency Medicine | Admitting: Emergency Medicine

## 2016-06-04 DIAGNOSIS — Z79899 Other long term (current) drug therapy: Secondary | ICD-10-CM | POA: Insufficient documentation

## 2016-06-04 DIAGNOSIS — J111 Influenza due to unidentified influenza virus with other respiratory manifestations: Secondary | ICD-10-CM | POA: Insufficient documentation

## 2016-06-04 DIAGNOSIS — R69 Illness, unspecified: Secondary | ICD-10-CM

## 2016-06-04 DIAGNOSIS — F17200 Nicotine dependence, unspecified, uncomplicated: Secondary | ICD-10-CM | POA: Insufficient documentation

## 2016-06-04 DIAGNOSIS — I10 Essential (primary) hypertension: Secondary | ICD-10-CM | POA: Insufficient documentation

## 2016-06-04 HISTORY — DX: Essential (primary) hypertension: I10

## 2016-06-04 MED ORDER — LISINOPRIL-HYDROCHLOROTHIAZIDE 10-12.5 MG PO TABS: 1.0000 | ORAL_TABLET | Freq: Every day | ORAL | 0 refills | Status: DC

## 2016-06-04 MED ORDER — LISINOPRIL 10 MG PO TABS
10.0000 mg | ORAL_TABLET | Freq: Once | ORAL | Status: AC
  Administered 2016-06-04: 10 mg via ORAL
  Filled 2016-06-04: qty 1

## 2016-06-04 MED ORDER — ACETAMINOPHEN 500 MG PO TABS
1000.0000 mg | ORAL_TABLET | Freq: Once | ORAL | Status: AC
  Administered 2016-06-04: 1000 mg via ORAL
  Filled 2016-06-04: qty 2

## 2016-06-04 MED ORDER — OSELTAMIVIR PHOSPHATE 75 MG PO CAPS: 75.0000 mg | ORAL_CAPSULE | Freq: Two times a day (BID) | ORAL | 0 refills | Status: DC

## 2016-06-04 MED ORDER — ONDANSETRON 4 MG PO TBDP
4.0000 mg | ORAL_TABLET | Freq: Once | ORAL | Status: AC
  Administered 2016-06-04: 4 mg via ORAL
  Filled 2016-06-04: qty 1

## 2016-06-04 MED ORDER — OSELTAMIVIR PHOSPHATE 75 MG PO CAPS
75.0000 mg | ORAL_CAPSULE | Freq: Once | ORAL | Status: AC
  Administered 2016-06-04: 75 mg via ORAL
  Filled 2016-06-04: qty 1

## 2016-06-04 NOTE — ED Notes (Signed)
Pt ambulated to room from waiting area. Pt placed on bp cuff and o2 monitor. Pt stated sob while ambulating.

## 2016-06-04 NOTE — ED Provider Notes (Addendum)
[facility]ER[facility]ENCY DEPT Provider Note   CSN: [id number] Arrival date & time: 06/04/16  0727     History   Chief Complaint Chief Complaint  Patient presents with  . Influenza    HPI Lindsay Keith is a 41 y.o. female.  The history is provided by the patient.  Emesis   This is a new problem. The current episode started 2 days ago. Episode frequency: 6-7 times in last day. The problem has not changed since onset.There has been no fever. Associated symptoms include chills, cough (greenish sputum), myalgias and sweats. Risk factors include ill contacts.    Past Medical History:  Diagnosis Date  . Hypertension     Patient Active Problem List   Diagnosis Date Noted  . Influenza-like illness 05/08/2011  . Bartholin's gland abscess 02/27/2011  . DEPRESSION, MAJOR 11/23/2007  . ESSENTIAL HYPERTENSION, BENIGN 11/23/2007  . Genital herpes 09/08/2006  . OBESITY, NOS 06/26/2006  . TOBACCO DEPENDENCE 06/26/2006  . HEMORRHOIDS, NOS 06/26/2006  . RHINITIS, ALLERGIC 06/26/2006  . ASTHMA, INTERMITTENT 06/26/2006  . GASTROESOPHAGEAL REFLUX, NO ESOPHAGITIS 06/26/2006    History reviewed. No pertinent surgical history.  OB History    Gravida Para Term Preterm AB Living   7 4 4   2 4    SAB TAB Ectopic Multiple Live Births   1       4       Home Medications    Prior to Admission medications   Medication Sig Start Date End Date Taking? Authorizing Provider  acyclovir (ZOVIRAX) 200 MG capsule Take 2 capsules (400 mg total) by mouth 3 (three) times daily. 04/08/14   Elenora Gamma, MD  buPROPion (WELLBUTRIN) 100 MG tablet Take 100 mg by mouth 2 (two) times daily.      Historical Provider, MD  HYDROcodone-homatropine (HYCODAN) 5-1.5 MG/5ML syrup Take 5 mLs by mouth every 8 (eight) hours as needed for cough. 05/08/11 05/18/11  Rodolph Bong, MD  lisinopril-hydrochlorothiazide (PRINZIDE,ZESTORETIC) 10-12.5 MG per tablet Take 1 tablet by mouth daily. 04/08/14   Elenora Gamma, MD    medroxyPROGESTERone (DEPO-PROVERA) 150 MG/ML injection Inject 150 mg into the muscle every 3 (three) months.      Historical Provider, MD    Family History History reviewed. No pertinent family history.  Social History Social History  Substance Use Topics  . Smoking status: Current Some Day Smoker  . Smokeless tobacco: Never Used  . Alcohol use Yes     Allergies   Patient has no known allergies.   Review of Systems Review of Systems  Constitutional: Positive for chills.  Respiratory: Positive for cough (greenish sputum).   Gastrointestinal: Positive for vomiting.  Musculoskeletal: Positive for myalgias.  All other systems reviewed and are negative.    Physical Exam Updated Vital Signs BP (!) 206/126 (BP Location: Right Arm)   Pulse 120   Temp 98.8 F (37.1 C) (Oral)   Resp 25   SpO2 95%   Physical Exam  Constitutional: She is oriented to person, place, and time. She appears well-developed and well-nourished. No distress.  HENT:  Head: Normocephalic.  Nose: Nose normal.  Eyes: Conjunctivae are normal.  Neck: Neck supple. No tracheal deviation present.  Cardiovascular: Normal rate, regular rhythm and normal heart sounds.   Pulmonary/Chest: Effort normal and breath sounds normal. No respiratory distress.  Abdominal: Soft. She exhibits no distension.  Neurological: She is alert and oriented to person, place, and time.  Skin: Skin is warm and dry.  Psychiatric: She has  a normal mood and affect.  Vitals reviewed.    ED Treatments / Results  Labs (all labs ordered are listed, but only abnormal results are displayed) Labs Reviewed - No data to display  EKG  EKG Interpretation None       Radiology Dg Chest 2 View  Result Date: 06/04/2016 CLINICAL DATA:  Cough, body aches,shortness of breath, and fever. EXAM: CHEST  2 VIEW COMPARISON:  PA and lateral chest x-ray of May 03, 2010 FINDINGS: The lungs are adequately inflated and clear. The heart and  pulmonary vascularity are normal. The mediastinum is normal in width. There is no pleural effusion. The trachea is midline. The observed bony thorax is unremarkable. IMPRESSION: There is no evidence of pneumonia nor other acute cardiopulmonary abnormality. Electronically Signed   By: David  SwazilandJordan M.D.   On: 06/04/2016 08:06    Procedures Procedures (including critical care time)  Medications Ordered in ED Medications - No data to display   Initial Impression / Assessment and Plan / ED Course  I have reviewed the triage vital signs and the nursing notes.  Pertinent labs & imaging results that were available during my care of the patient were reviewed by me and considered in my medical decision making (see chart for details).     41 y.o. female presents with cough, chills, body aches and nausea c/w viral illness. 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 Will treat empirically with tamiflu. AFVSS here with no evidence of secondary pneumonia or other complication. Has been noncompliant with antihypertensives, dose was given here. Plan to follow up with PCP as needed and return precautions discussed for worsening or new concerning symptoms.    Final Clinical Impressions(s) / ED Diagnoses   Final diagnoses:  Influenza-like illness  Chronic hypertension    New Prescriptions Discharge Medication List as of 06/04/2016 10:53 AM    START taking these medications   Details  oseltamivir (TAMIFLU) 75 MG capsule Take 1 capsule (75 mg total) by mouth every 12 (twelve) hours., Starting Tue 06/04/2016, Print         Lyndal Pulleyaniel Maveryck Bahri, MD 06/04/16 1742    Lyndal Pulleyaniel Izan Miron, MD 06/04/16 [phone]

## 2016-06-04 NOTE — ED Triage Notes (Signed)
Pt here for body aches and cough; pt sts SOB and fever

## 2016-06-04 NOTE — ED Notes (Signed)
Dr. Clydene PughKnott  Made aware of patients heart rate of 127, MD advised to PO challenge patient and that patient could be discharged as long as she was able to hold fluids down.

## 2016-10-14 ENCOUNTER — Ambulatory Visit (INDEPENDENT_AMBULATORY_CARE_PROVIDER_SITE_OTHER): Payer: BLUE CROSS/BLUE SHIELD | Admitting: Obstetrics and Gynecology

## 2016-10-14 ENCOUNTER — Other Ambulatory Visit (HOSPITAL_COMMUNITY)
Admission: RE | Admit: 2016-10-14 | Discharge: 2016-10-14 | Disposition: A | Discharge status: Home / Self Care | Payer: BLUE CROSS/BLUE SHIELD | Source: Ambulatory Visit | Attending: Family Medicine | Admitting: Family Medicine

## 2016-10-14 ENCOUNTER — Encounter: Payer: Self-pay | Admitting: Obstetrics and Gynecology

## 2016-10-14 VITALS — BP 160/110 | HR 115 | Temp 98.0°F | Ht 61.0 in | Wt 235.0 lb

## 2016-10-14 DIAGNOSIS — Z113 Encounter for screening for infections with a predominantly sexual mode of transmission: Secondary | ICD-10-CM | POA: Diagnosis present

## 2016-10-14 DIAGNOSIS — Z3200 Encounter for pregnancy test, result unknown: Secondary | ICD-10-CM | POA: Diagnosis not present

## 2016-10-14 DIAGNOSIS — A5901 Trichomonal vulvovaginitis: Secondary | ICD-10-CM | POA: Insufficient documentation

## 2016-10-14 DIAGNOSIS — L02411 Cutaneous abscess of right axilla: Secondary | ICD-10-CM

## 2016-10-14 DIAGNOSIS — Z124 Encounter for screening for malignant neoplasm of cervix: Secondary | ICD-10-CM | POA: Insufficient documentation

## 2016-10-14 DIAGNOSIS — L739 Follicular disorder, unspecified: Secondary | ICD-10-CM | POA: Diagnosis not present

## 2016-10-14 DIAGNOSIS — I10 Essential (primary) hypertension: Secondary | ICD-10-CM

## 2016-10-14 DIAGNOSIS — N912 Amenorrhea, unspecified: Secondary | ICD-10-CM | POA: Diagnosis not present

## 2016-10-14 LAB — POCT URINE PREGNANCY: PREG TEST UR: NEGATIVE

## 2016-10-14 MED ORDER — LISINOPRIL-HYDROCHLOROTHIAZIDE 10-12.5 MG PO TABS: 1.0000 | ORAL_TABLET | Freq: Every day | ORAL | 1 refills | Status: DC

## 2016-10-14 NOTE — Progress Notes (Signed)
Subjective: Chief Complaint  Patient presents with  . Amenorrhea  . Recurrent Skin Infections    under right arm and in groin     HPI: Lindsay Keith is a 41 y.o. presenting to clinic today to discuss the following:  #Amenorrhea Patient's last menstrual period was 08/14/2016. Not on birth control Sexually active with husband Home urine pregnancy tet was both positive and negative Denies pelvic or abdominal pain, abnormal discharge, fevers Cycles have never been completely normal FH of PCOS  #Skin abscess In axilla of right arm Drained pus about 2 days Still with minimal drainage No associated fevers, warmth or erythema to area No pain since drained  #Bumps in groin  Has been feeling bumps around her labia Small bumps that are scattered No rash Not painful Do not drain Does shave  #HTN BP elevated today patient not taking BP medication because ran out a while ago Did not think she needed anymore Denies chest pain, dyspnea, headache, n/v  ROS noted in HPI.   Past Medical, Surgical, Social, and Family History Reviewed & Updated per EMR.    History  Smoking Status  . Current Some Day Smoker  Smokeless Tobacco  . Never Used    Objective: BP (!) 160/110   Pulse (!) 115   Temp 98 F (36.7 C) (Oral)   Ht 5\' 1"  (1.549 m)   Wt 235 lb (106.6 kg)   LMP 08/14/2016   SpO2 98%   BMI 44.40 kg/m  Vitals and nursing notes reviewed  Physical Exam  Constitutional: She is well-developed, well-nourished, and in no distress.  HENT:  Mouth/Throat: Oropharynx is clear and moist.  Eyes: EOM are normal.  Cardiovascular: Tachycardia present.   Pulmonary/Chest: Effort normal.  Abdominal: Soft. She exhibits no distension. There is no tenderness. There is no guarding.  Genitourinary: Cervix exhibits no motion tenderness.  Genitourinary Comments: Normal introitus for age, 4 small flesh-colored papules at the end of hair follicles (scattered), no vaginal  discharge, mucosa pink and moist, no vaginal or cervical lesions, no vaginal atrophy, no friaility or hemorrhage, normal uterus size and position, no adnexal masses or tenderness  Musculoskeletal:  Right axilla with 1cm abscess that is minimally draining. No tenderness or surrounding erythema.   Neurological: She is alert. No cranial nerve deficit.    Results for orders placed or performed in visit on 10/14/16 (from the past 72 hour(s))  POCT urine pregnancy     Status: None   Collection Time: 10/14/16  1:41 PM  Result Value Ref Range   Preg Test, Ur Negative Negative    Assessment/Plan: 1. Amenorrhea Irregular menstrual cycles. Cause for amenorrhea at this time unknown. Can r/o pregnancy with negative upreg. Most likely having anovulation. With FH of PCOS will check labs to see if patient at risk for PCOS. Labs will also tell me about her hormonal levels. Does not appear to be in early menopause. Will do initial work-up then re-evaluate. Discussed starting birth control for hormonal regulation patient hesitant and would like to think on it. Would recommend IUD. May also need pelvic US in future to assess endometrium and anatomy.  - Luteinizing hormone - Follicle stimulating hormone - TSH - DHEA - Testosterone - Prolactin  2. Possible pregnancy, not yet confirmed Upreg negative - POCT urine pregnancy  3. Essential hypertension, benign Not-controlled. Non-compliant with therapy. Refill of BP medication given. Encouraged patient to take medications as she is putting herself at risk  - lisinopril-hydrochlorothiazide (PRINZIDE,ZESTORETIC) 10-12.5 MG  tablet; Take 1 tablet by mouth daily.  Dispense: 30 tablet; Refill: 1  4. Screening for malignant neoplasm of cervix Performed pap smear with genital exam er patient wishes. She was due for one in 4 months. Will contact about results.  - Cytology - PAP [place]  5. Screen for STD (sexually transmitted disease) STD screenign with pap -  Cervicovaginal ancillary only  6. Folliculitis Bumps around genitalia consistent with folliculitis. Not acutely inflamed at this time. No signs of infection. Likely associated with shaving. Discussed watchful waiting and good hygiene with patient.   7. Abscess of axilla, right Minimal drainage at this time. No need for antibiotics. Conservative measures. Return precautions given.   PATIENT EDUCATION PROVIDED: See AVS    Return by end of week for nurse visit for vitals recheck  Caryl AdaJazma Phelps, DO 10/14/2016, 2:02 PM PGY-3, [facility]

## 2016-10-14 NOTE — Progress Notes (Signed)
Patient left without having her blood pressure checked.  She also mentioned that she wasn't taking any blood pressure medication at this time.

## 2016-10-14 NOTE — Patient Instructions (Signed)
Polycystic Ovarian Syndrome Polycystic ovarian syndrome (PCOS) is a common hormonal disorder among women of reproductive age. In most women with PCOS, many small fluid-filled sacs (cysts) grow on the ovaries, and the cysts are not part of a normal menstrual cycle. PCOS can cause problems with your menstrual periods and make it difficult to get pregnant. It can also cause an increased risk of miscarriage with pregnancy. If it is not treated, PCOS can lead to serious health problems, such as diabetes and heart disease. What are the causes? The cause of PCOS is not known, but it may be the result of a combination of certain factors, such as:  Irregular menstrual cycle.  High levels of certain hormones (androgens).  Problems with the hormone that helps to control blood sugar (insulin resistance).  Certain genes.  What increases the risk? This condition is more likely to develop in women who have a family history of PCOS. What are the signs or symptoms? Symptoms of PCOS may include:  Multiple ovarian cysts.  Infrequent periods or no periods.  Periods that are too frequent or too heavy.  Unpredictable periods.  Inability to get pregnant (infertility) because of not ovulating.  Increased growth of hair on the face, chest, stomach, back, thumbs, thighs, or toes.  Acne or oily skin. Acne may develop during adulthood, and it may not respond to treatment.  Pelvic pain.  Weight gain or obesity.  Patches of thickened and dark brown or black skin on the neck, arms, breasts, or thighs (acanthosis nigricans).  Excess hair growth on the face, chest, abdomen, or upper thighs (hirsutism).  How is this diagnosed? This condition is diagnosed based on:  Your medical history.  A physical exam, including a pelvic exam. Your health care provider may look for areas of increased hair growth on your skin.  Tests, such as: ? Ultrasound. This may be used to examine the ovaries and the lining of the  uterus (endometrium) for cysts. ? Blood tests. These may be used to check levels of sugar (glucose), female hormone (testosterone), and female hormones (estrogen and progesterone) in your blood.  How is this treated? There is no cure for PCOS, but treatment can help to manage symptoms and prevent more health problems from developing. Treatment varies depending on:  Your symptoms.  Whether you want to have a baby or whether you need birth control (contraception).  Treatment may include nutrition and lifestyle changes along with:  Progesterone hormone to start a menstrual period.  Birth control pills to help you have regular menstrual periods.  Medicines to make you ovulate, if you want to get pregnant.  Medicine to reduce excessive hair growth.  Surgery, in severe cases. This may involve making small holes in one or both of your ovaries. This decreases the amount of testosterone that your body produces.  Follow these instructions at home:  Take over-the-counter and prescription medicines only as told by your health care provider.  Follow a healthy meal plan. This can help you reduce the effects of PCOS. ? Eat a healthy diet that includes lean proteins, complex carbohydrates, fresh fruits and vegetables, low-fat dairy products, and healthy fats. Make sure to eat enough fiber.  If you are overweight, lose weight as told by your health care provider. ? Losing 10% of your body weight may improve symptoms. ? Your health care provider can determine how much weight loss is best for you and can help you lose weight safely.  Keep all follow-up visits as told by   your health care provider. This is important. Contact a health care provider if:  Your symptoms do not get better with medicine.  You develop new symptoms. This information is not intended to replace advice given to you by your health care provider. Make sure you discuss any questions you have with your health care  provider. Document Released: 08/09/2004 Document Revised: 12/12/2015 Document Reviewed: 10/01/2015 Elsevier Interactive Patient Education  2018 [employer]    Intrauterine Device Information An intrauterine device (IUD) is inserted into your uterus to prevent pregnancy. There are two types of IUDs available:  Copper IUD-This type of IUD is wrapped in copper wire and is placed inside the uterus. Copper makes the uterus and fallopian tubes produce a fluid that kills sperm. The copper IUD can stay in place for 10 years.  Hormone IUD-This type of IUD contains the hormone progestin (synthetic progesterone). The hormone thickens the cervical mucus and prevents sperm from entering the uterus. It also thins the uterine lining to prevent implantation of a fertilized egg. The hormone can weaken or kill the sperm that get into the uterus. One type of hormone IUD can stay in place for 5 years, and another type can stay in place for 3 years.  Your health care provider will make sure you are a good candidate for a contraceptive IUD. Discuss with your health care provider the possible side effects. Advantages of an intrauterine device  IUDs are highly effective, reversible, long acting, and low maintenance.  There are no estrogen-related side effects.  An IUD can be used when breastfeeding.  IUDs are not associated with weight gain.  The copper IUD works immediately after insertion.  The hormone IUD works right away if inserted within 7 days of your period starting. You will need to use a backup method of birth control for 7 days if the hormone IUD is inserted at any other time in your cycle.  The copper IUD does not interfere with your female hormones.  The hormone IUD can make heavy menstrual periods lighter and decrease cramping.  The hormone IUD can be used for 3 or 5 years.  The copper IUD can be used for 10 years. Disadvantages of an intrauterine device  The hormone IUD can be  associated with irregular bleeding patterns.  The copper IUD can make your menstrual flow heavier and more painful.  You may experience cramping and vaginal bleeding after insertion. This information is not intended to replace advice given to you by your health care provider. Make sure you discuss any questions you have with your health care provider. Document Released: 03/19/2004 Document Revised: 09/21/2015 Document Reviewed: 10/04/2012 Elsevier Interactive Patient Education  2017 [employer]

## 2016-10-15 LAB — CERVICOVAGINAL ANCILLARY ONLY
Chlamydia: NEGATIVE
NEISSERIA GONORRHEA: NEGATIVE
TRICH (WINDOWPATH): POSITIVE — AB

## 2016-10-16 ENCOUNTER — Telehealth: Payer: Self-pay | Admitting: *Deleted

## 2016-10-16 ENCOUNTER — Other Ambulatory Visit: Payer: Self-pay | Admitting: Obstetrics and Gynecology

## 2016-10-16 LAB — TESTOSTERONE: Testosterone: 37 ng/dL (ref 8–48)

## 2016-10-16 LAB — DHEA: Dehydroepiandrosterone: 394 ng/dL (ref 31–701)

## 2016-10-16 LAB — PROLACTIN: Prolactin: 12.2 ng/mL (ref 4.8–23.3)

## 2016-10-16 LAB — CYTOLOGY - PAP
Adequacy: ABSENT
Diagnosis: NEGATIVE
HPV (WINDOPATH): NOT DETECTED

## 2016-10-16 LAB — LUTEINIZING HORMONE: LH: 7.7 m[IU]/mL

## 2016-10-16 LAB — TSH: TSH: 1.53 u[IU]/mL (ref 0.450–4.500)

## 2016-10-16 LAB — FOLLICLE STIMULATING HORMONE: FSH: 3.7 m[IU]/mL

## 2016-10-16 MED ORDER — METRONIDAZOLE 500 MG PO TABS: 500.0000 mg | ORAL_TABLET | Freq: Two times a day (BID) | ORAL | 0 refills | Status: AC

## 2016-10-16 NOTE — Telephone Encounter (Signed)
-----   Message from Pincus LargeJazma Y Phelps, DO sent at 10/16/2016  2:58 PM EDT ----- Called patient and left VM to call back for results. PCOS screening negative. Pap still pending. STD check positive for Trich. I will send in antibiotic to her pharmacy. Please also have her schedule a nurse visit for Friday for recheck on her vitals. Thanks!

## 2016-10-16 NOTE — Telephone Encounter (Signed)
Patient is aware of results and is unable to come in until Monday for her BP check due to her work schedule and us being closed early on Friday.  Ricci Paff,CMA

## 2018-03-17 ENCOUNTER — Ambulatory Visit (INDEPENDENT_AMBULATORY_CARE_PROVIDER_SITE_OTHER): Payer: Self-pay | Admitting: Family Medicine

## 2018-03-17 VITALS — BP 160/82 | HR 105 | Temp 97.8°F | Wt 235.0 lb

## 2018-03-17 DIAGNOSIS — O0990 Supervision of high risk pregnancy, unspecified, unspecified trimester: Secondary | ICD-10-CM | POA: Insufficient documentation

## 2018-03-17 DIAGNOSIS — Z3200 Encounter for pregnancy test, result unknown: Secondary | ICD-10-CM

## 2018-03-17 DIAGNOSIS — Z3201 Encounter for pregnancy test, result positive: Secondary | ICD-10-CM

## 2018-03-17 LAB — POCT URINE PREGNANCY: Preg Test, Ur: POSITIVE — AB

## 2018-03-17 NOTE — Patient Instructions (Signed)
It was great seeing you today! We have addressed the following issues today  1. Congratulations, you pregnancy was positive as you thought. I want you to start taking prenatal vitamins.  2. We will schedule you with a provider who will order labs for you.  If we did any lab work today, and the results require attention, either me or my nurse will get in touch with you. If everything is normal, you will get a letter in mail and a message via . If you don't hear from us in two weeks, please give us a call. Otherwise, we look forward to seeing you again at your next visit. If you have any questions or concerns before then, please call the clinic at [phone].  Please bring all your medications to every doctors visit  Sign up for My Chart to have easy access to your labs results, and communication with your Primary care physician. Please ask Front Desk for some assistance.   Please check-out at the front desk before leaving the clinic.    Take Care,   Dr. Sydnee Cabaliallo

## 2018-03-17 NOTE — Assessment & Plan Note (Signed)
Positive urine pregnancy test. Counseled patient on starting prenatal vitamins and avoiding alcohol and smoking. She is self pay [facility]. She is schedule to see provider and two weeks and will schedule initial OB labs.

## 2018-03-17 NOTE — Progress Notes (Signed)
   Subjective:    Patient ID: Lindsay Keith, female    DOB: 17-Sep-1975, 42 y.o.   MRN: [id number]   CC: Pregnancy confirmation  HPI: Patient is 42 yo female who presents today for pregnancy test. Patient reports she has missed her menstrual cycle the past 5 months and last week she took two pregnancy test that were positive on Thursday and Friday. She has been having some nausea and vomiting intermittently. She was last on birth control 7 years ago and told she needed to stopped because she had depo provera for 19 years. She denies any smoking or alcohol use. This was an unplanned pregnancy.   Smoking status reviewed   ROS: all other systems were reviewed and are negative other than in the HPI   Past Medical History:  Diagnosis Date  . Hypertension     History reviewed. No pertinent surgical history.  Past medical history, surgical, family, and social history reviewed and updated in the EMR as appropriate.  Objective:  BP (!) 160/82   Pulse (!) 105   Temp 97.8 F (36.6 C) (Oral)   Wt 235 lb (106.6 kg)   LMP 10/15/2017   SpO2 97%   BMI 44.40 kg/m   Vitals and nursing note reviewed  General: NAD, pleasant, able to participate in exam Cardiac: RRR, normal heart sounds, no murmurs. 2+ radial and PT pulses bilaterally Respiratory: CTAB, normal effort, No wheezes, rales or rhonchi Abdomen: soft, nontender, nondistended, no hepatic or splenomegaly, +BS Extremities: no edema or cyanosis. WWP. Skin: warm and dry, no rashes noted Neuro: alert and oriented x4, no focal deficits Psych: Normal affect and mood   Assessment & Plan:   Possible pregnancy Positive urine pregnancy test. Counseled patient on starting prenatal vitamins and avoiding alcohol and smoking. She is self pay and [facility]. She is schedule to see provider and two weeks and will schedule initial OB labs.    Lovena NeighboursAbdoulaye Cordell Coke, MD [facility]  PGY-3

## 2018-03-18 ENCOUNTER — Other Ambulatory Visit: Payer: Self-pay

## 2018-03-18 ENCOUNTER — Other Ambulatory Visit: Payer: Self-pay | Admitting: *Deleted

## 2018-03-18 DIAGNOSIS — Z3400 Encounter for supervision of normal first pregnancy, unspecified trimester: Secondary | ICD-10-CM

## 2018-03-18 NOTE — Progress Notes (Signed)
Patient has an appointment to get her pregnancy medicaid tomorrow and will call our office to schedule her new OB labs once this is done.  Glennis Borger,CMA

## 2018-04-09 ENCOUNTER — Other Ambulatory Visit: Payer: Self-pay

## 2018-04-09 ENCOUNTER — Ambulatory Visit (HOSPITAL_COMMUNITY)
Admission: RE | Admit: 2018-04-09 | Discharge: 2018-04-09 | Disposition: A | Discharge status: Home / Self Care | Payer: Medicaid Other | Source: Ambulatory Visit | Attending: Family Medicine | Admitting: Family Medicine

## 2018-04-09 ENCOUNTER — Other Ambulatory Visit (HOSPITAL_COMMUNITY)
Admission: RE | Admit: 2018-04-09 | Discharge: 2018-04-09 | Disposition: A | Discharge status: Home / Self Care | Payer: Medicaid Other | Source: Ambulatory Visit | Attending: Family Medicine | Admitting: Family Medicine

## 2018-04-09 ENCOUNTER — Encounter: Payer: Self-pay | Admitting: Family Medicine

## 2018-04-09 ENCOUNTER — Ambulatory Visit (INDEPENDENT_AMBULATORY_CARE_PROVIDER_SITE_OTHER): Payer: Medicaid Other | Admitting: Family Medicine

## 2018-04-09 ENCOUNTER — Ambulatory Visit (HOSPITAL_COMMUNITY): Payer: Medicaid Other

## 2018-04-09 VITALS — BP 152/98 | HR 107 | Temp 98.2°F | Wt 238.1 lb

## 2018-04-09 DIAGNOSIS — Z3400 Encounter for supervision of normal first pregnancy, unspecified trimester: Secondary | ICD-10-CM | POA: Diagnosis present

## 2018-04-09 DIAGNOSIS — Z3200 Encounter for pregnancy test, result unknown: Secondary | ICD-10-CM | POA: Diagnosis not present

## 2018-04-09 DIAGNOSIS — O161 Unspecified maternal hypertension, first trimester: Secondary | ICD-10-CM | POA: Insufficient documentation

## 2018-04-09 DIAGNOSIS — Z23 Encounter for immunization: Secondary | ICD-10-CM

## 2018-04-09 DIAGNOSIS — O10919 Unspecified pre-existing hypertension complicating pregnancy, unspecified trimester: Secondary | ICD-10-CM | POA: Diagnosis not present

## 2018-04-09 LAB — POCT URINALYSIS DIP (MANUAL ENTRY)
BILIRUBIN UA: NEGATIVE
Blood, UA: NEGATIVE
GLUCOSE UA: NEGATIVE mg/dL
Ketones, POC UA: NEGATIVE mg/dL
LEUKOCYTES UA: NEGATIVE
Nitrite, UA: NEGATIVE
SPEC GRAV UA: 1.02 (ref 1.010–1.025)
Urobilinogen, UA: 0.2 E.U./dL
pH, UA: 7 (ref 5.0–8.0)

## 2018-04-09 MED ORDER — LABETALOL HCL 100 MG PO TABS: 100.0000 mg | ORAL_TABLET | Freq: Two times a day (BID) | ORAL | 2 refills | Status: DC

## 2018-04-09 NOTE — Patient Instructions (Addendum)
It was great to meet you today.  I am sorry that I will be able to be here OB provider for this pregnancy.  We can try to set you up with an appropriate high risk pregnancy provider.  I know we talked about a lot of things today, 1 of the best things you can do for this pregnancy in your health is to stop smoking.  Please return to clinic in 1 week for a blood pressure check.  Please come back to the clinic in 2 weeks for her next prenatal care visit.  After that, you should be able to see a high risk provider.  Smoking During Pregnancy Smoking during pregnancy is unhealthy for you and your baby. Smoke from cigarettes, pipes, and cigars contains many chemicals that can cause cancer (carcinogens). Cigarettes also contain a stimulant drug (nicotine). When you smoke, harmful substances that you breathe in enter your bloodstream and can be passed on to your baby. This can affect your baby's development. If you are planning to become pregnant or have recently become pregnant, talk with your health care provider about quitting smoking. How does smoking affect me? Smoking increases your risk for many long-term (chronic) diseases. These diseases include cancer, lung diseases, and heart disease. Smoking during pregnancy increases your risk of:  Losing the pregnancy (miscarriage or stillbirth).  Giving birth too early (premature birth).  Pregnancy outside of the uterus (tubal pregnancy).  Having problems with the organ that provides the baby nourishment and oxygen (placenta), including: ? Attachment of the placenta over the opening of the uterus (placenta previa). ? Detachment of the placenta before the baby's birth (placental abruption).  Having your water break before labor begins (premature rupture of membranes).  How does smoking affect my baby? Before Birth Smoking during pregnancy:  Decreases blood flow and oxygen to your baby.  Increases your baby's risk of birth defects, such as heart  defects.  Increases your baby's heart rate.  Slows your baby's growth in the uterus (intrauterine growth retardation).  After Birth Babies born to women who smoked during pregnancy may:  Have symptoms of nicotine withdrawal.  Need to stay in the hospital for special care.  May be too small at birth.  Have a high risk of: ? Serious health problems or lifelong disabilities. ? Sudden infant death syndrome (SIDS). ? Becoming obese. ? Developing behavior or learning problems.  What can happen if changes are not made? When babies are born with a birth defect or illness, they often need to stay in the hospital longer before going home. Hospital stays may also be longer if you had any complications during labor or delivery. Longer hospital stays and more treatments result in higher costs for health care. Many health issues among babies born to mothers who smoke can have a lifelong impact. This may include the long-term need for certain medicines, therapies, or other treatments. What are the benefits of not smoking during pregnancy? You have a much better chance of having a healthy pregnancy and a healthy baby if you do not smoke while you are pregnant. Not smoking also means that you will have a better chance of living a long and healthy life, and your baby will have a better chance of growing into a healthy child and adult. What actions can be taken? Quitting smoking can be difficult. Ask your health care provider for help to stop smoking. You may also consider:  Counseling to help you quit smoking (smoking cessation counseling).  Psychotherapy.  Acupuncture.  Hypnosis.  Telephone [employer].  If these methods do not help you, talk with your health care provider about other options. Do not take smoking cessation medicines or nicotine supplements unless your health care provider tells you to. Where to find more information: Learn more about smoking during pregnancy and quitting  smoking from:  March of Dimes: www.marchofdimes.org/pregnancy/smoking-during-pregnancy.aspx  U.S. Department of Health and Human Services: women.smokefree.gov  American Cancer Society: www.cancer.org  American Heart Association: www.heart.org  National Cancer Institute: www.cancer.gov  For help to quit smoking:  National smoking cessation telephone hotline: 1-800-QUIT NOW 712-135-3922)  Contact a health care provider if:  You are struggling to quit smoking.  You are a smoker and you become pregnant or plan to become pregnant.  You start smoking again after giving birth. Summary  Tobacco smoke contains harmful substances that can affect a baby's health and development.  Smoking increases the risk for serious problems, such as miscarriage, birth defects, or premature birth.  If you need help to quit smoking, ask your health care provider. This information is not intended to replace advice given to you by your health care provider. Make sure you discuss any questions you have with your health care provider. Document Released: 08/27/2004 Document Revised: 02/02/2016 Document Reviewed: 02/02/2016 Elsevier Interactive Patient Education  2018 [employer].

## 2018-04-09 NOTE — Progress Notes (Addendum)
Lindsay Keith is a 42 y.o. yo [id number] at Unknown who presents for her initial prenatal visit. Pregnancy is not planned.  She began her relationship with her father this past year and is excited for the pregnancy.  She is hoping to have twins.  She has not had a period since January 2019.  She has not had intercourse between January and September 2019.  She does not think that she could be more than 3 months pregnant.  She has not yet had her initial prenatal labs drawn.  She denies contractions, vaginal bleeding, fetal movement. She reports fatigue, frequent urination, morning sickness, nausea and pregnancy test confirmed in clinic. She  is taking PNV. See flow sheet for details.   PMH, POBH, FH, meds, allergies and Social Hx reviewed.  Prior to patient leaving the clinic, I spoke with Dr. Vergie LivingPickens from [facility].  He recommended referring her to his clinic in addition to obtaining preeclampsia/hypertension labs (TSH, urine protein/creatinine ratio, liver enzymes) in addition to a baseline EKG.  He also recommended starting labetalol for gestational hypertension.  Prenatal Exam: Gen: Well nourished, well developed.  No distress.  Vitals noted. HEENT: Normocephalic, atraumatic.  Neck supple without cervical lymphadenopathy, thyromegaly or thyroid nodules.  Fair dentition. CV: Mildly tachycardic, no murmur, gallops or rubs Lungs: CTAB.  Normal respiratory effort without wheezes or rales. Abd: soft, NTND. +BS.  Uterus not appreciated above pelvis. GU: Normal external female genitalia without lesions.  Normal vaginal, wall rugated without lesions. No vaginal discharge.  Bimanual exam: No adnexal mass or TTP. No CMT.  Uterus size: not palpable Ext: No clubbing, cyanosis or edema. Psych: Normal grooming and dress.  Not depressed or anxious appearing.  Normal thought content and process without flight of ideas or looseness of associations.  Assessment & Plan: 1) 42 y.o. yo G3 P1011 at unknown  gestational age (not more than 3 months) currently feeling well. -Referral to high risk OB due to age [age]) and pre-gestational hypertension -Follow-up Pre-eclampsia/HTN labs (TSH, urine protein creatinine ratio, liver enzymes) -Baseline EKG taken today -Follow-up prenatal labs next visit in 2 weeks -Labetalol 100 mg twice daily -Dating is not reliable.  Follow-up ultrasound. -Prenatal labs drawn today. Genetic screening offered: declined. -Early glucola is indicated.  Substituting A1c per the advice of high risk OB curbside. -Pregnancy Medical Home forms completed and reviewed. -Bleeding and pain precautions reviewed. -Importance of prenatal vitamins reviewed.   Follow-up in 1 week for blood pressure check Follow-up in 2 weeks for next prenatal visit ([facility], subsequent visits will be with a high risk provider)

## 2018-04-10 LAB — OBSTETRIC PANEL, INCLUDING HIV
Antibody Screen: NEGATIVE
BASOS ABS: 0.1 10*3/uL (ref 0.0–0.2)
Basos: 0 %
EOS (ABSOLUTE): 0.3 10*3/uL (ref 0.0–0.4)
Eos: 2 %
HEP B S AG: NEGATIVE
HIV Screen 4th Generation wRfx: NONREACTIVE
Hematocrit: 38.3 % (ref 34.0–46.6)
Hemoglobin: 12.6 g/dL (ref 11.1–15.9)
IMMATURE GRANS (ABS): 0.1 10*3/uL (ref 0.0–0.1)
IMMATURE GRANULOCYTES: 1 %
LYMPHS ABS: 3.4 10*3/uL — AB (ref 0.7–3.1)
LYMPHS: 21 %
MCH: 29.5 pg (ref 26.6–33.0)
MCHC: 32.9 g/dL (ref 31.5–35.7)
MCV: 90 fL (ref 79–97)
MONOCYTES: 5 %
Monocytes Absolute: 0.8 10*3/uL (ref 0.1–0.9)
NEUTROS PCT: 71 %
Neutrophils Absolute: 11.8 10*3/uL — ABNORMAL HIGH (ref 1.4–7.0)
Platelets: 347 10*3/uL (ref 150–450)
RBC: 4.27 x10E6/uL (ref 3.77–5.28)
RDW: 13.4 % (ref 12.3–15.4)
RPR: NONREACTIVE
Rh Factor: NEGATIVE
Rubella Antibodies, IGG: 1.9 index (ref >0.99)
WBC: 16.4 10*3/uL — AB (ref 3.4–10.8)

## 2018-04-10 LAB — HEMOGLOBIN A1C
Est. average glucose Bld gHb Est-mCnc: 137 mg/dL
Hgb A1c MFr Bld: 6.4 % — ABNORMAL HIGH (ref 4.8–5.6)

## 2018-04-10 LAB — TSH: TSH: 2.89 u[IU]/mL (ref 0.450–4.500)

## 2018-04-11 LAB — URINE CULTURE, OB REFLEX

## 2018-04-11 LAB — CULTURE, OB URINE

## 2018-04-14 ENCOUNTER — Other Ambulatory Visit: Payer: Self-pay | Admitting: Family Medicine

## 2018-04-14 DIAGNOSIS — A599 Trichomoniasis, unspecified: Secondary | ICD-10-CM

## 2018-04-14 LAB — COMPREHENSIVE METABOLIC PANEL
ALBUMIN: 3.7 g/dL (ref 3.5–5.5)
ALK PHOS: 90 IU/L (ref 39–117)
ALT: 8 IU/L (ref 0–32)
AST: 10 IU/L (ref 0–40)
Albumin/Globulin Ratio: 1.4 (ref 1.2–2.2)
BUN / CREAT RATIO: 11 (ref 9–23)
BUN: 6 mg/dL (ref 6–24)
Bilirubin Total: <0.2 mg/dL (ref 0.0–1.2)
CALCIUM: 9.3 mg/dL (ref 8.7–10.2)
CO2: 23 mmol/L (ref 20–29)
Chloride: 97 mmol/L (ref 96–106)
Creatinine, Ser: 0.53 mg/dL — ABNORMAL LOW (ref 0.57–1.00)
GFR calc Af Amer: 135 mL/min/{1.73_m2} (ref >59)
GFR, EST NON AFRICAN AMERICAN: 117 mL/min/{1.73_m2} (ref >59)
GLOBULIN, TOTAL: 2.7 g/dL (ref 1.5–4.5)
Glucose: 111 mg/dL — ABNORMAL HIGH (ref 65–99)
Potassium: 4.2 mmol/L (ref 3.5–5.2)
SODIUM: 135 mmol/L (ref 134–144)
Total Protein: 6.4 g/dL (ref 6.0–8.5)

## 2018-04-14 LAB — PROTEIN / CREATININE RATIO, URINE
Creatinine, Urine: 188.5 mg/dL
Protein, Ur: 22.7 mg/dL
Protein/Creat Ratio: 120 mg/g creat (ref 0–200)

## 2018-04-14 LAB — CERVICOVAGINAL ANCILLARY ONLY
BACTERIAL VAGINITIS: NEGATIVE
Candida vaginitis: NEGATIVE
Chlamydia: NEGATIVE
NEISSERIA GONORRHEA: NEGATIVE
Trichomonas: POSITIVE — AB

## 2018-04-14 LAB — HGB FRAC. W/SOLUBILITY
HGB A2 QUANT: 2 % (ref 1.8–3.2)
HGB A: 98 % (ref 96.4–98.8)
HGB F QUANT: 0 % (ref 0.0–2.0)
HGB S: 0 %
Hgb C: 0 %
Hgb Solubility: NEGATIVE
Hgb Variant: 0 %

## 2018-04-14 MED ORDER — METRONIDAZOLE 500 MG PO TABS: 2000.0000 mg | ORAL_TABLET | Freq: Once | ORAL | 0 refills | Status: DC

## 2018-04-14 MED ORDER — METRONIDAZOLE 500 MG PO TABS: 2000.0000 mg | ORAL_TABLET | Freq: Once | ORAL | 0 refills | Status: AC

## 2018-04-14 NOTE — Progress Notes (Signed)
I called Lindsay Keith and informed her that she tested positive for Trichomonas and that an Rx was sent to her pharmacy.  She was encouraged to inform her partner of the infection so he could be seen and treated as well (partner is not a patient of out clinic).

## 2018-04-15 ENCOUNTER — Ambulatory Visit: Payer: Medicaid Other

## 2018-04-15 VITALS — BP 148/94

## 2018-04-15 DIAGNOSIS — O10919 Unspecified pre-existing hypertension complicating pregnancy, unspecified trimester: Secondary | ICD-10-CM

## 2018-04-15 NOTE — Progress Notes (Signed)
   Patient in to nurse clinic for BP check. BP is 148/94. Denies HA, CP, SOB, visual changes.  States has been compliant with Labetalol 100 mg BID. Last dose approximately 45 minutes ago.  Has return appt next week.  Ples SpecterAlisa Akshar Starnes, RN [facility] RN)

## 2018-04-17 ENCOUNTER — Encounter: Payer: Self-pay | Admitting: Family Medicine

## 2018-04-20 ENCOUNTER — Encounter: Payer: Self-pay | Admitting: Family Medicine

## 2018-04-20 ENCOUNTER — Ambulatory Visit (INDEPENDENT_AMBULATORY_CARE_PROVIDER_SITE_OTHER): Payer: Medicaid Other | Admitting: Family Medicine

## 2018-04-20 ENCOUNTER — Other Ambulatory Visit: Payer: Self-pay | Admitting: Family Medicine

## 2018-04-20 VITALS — BP 132/78 | HR 109 | Temp 98.8°F | Wt 241.4 lb

## 2018-04-20 DIAGNOSIS — Z3200 Encounter for pregnancy test, result unknown: Secondary | ICD-10-CM | POA: Diagnosis not present

## 2018-04-20 DIAGNOSIS — K219 Gastro-esophageal reflux disease without esophagitis: Secondary | ICD-10-CM | POA: Diagnosis not present

## 2018-04-20 DIAGNOSIS — Z3490 Encounter for supervision of normal pregnancy, unspecified, unspecified trimester: Secondary | ICD-10-CM

## 2018-04-20 MED ORDER — FAMOTIDINE 20 MG PO TABS: 20.0000 mg | ORAL_TABLET | Freq: Two times a day (BID) | ORAL | 0 refills | Status: DC

## 2018-04-20 NOTE — Patient Instructions (Addendum)
Please remember to get your dating ultrasound done.    You will follow-up with high-risk OB clinic at [facility] on January 13.  Continue taking your blood pressure medications, prenatal vitamins.  Take famotidine for acid reflux.   Safe Medications in Pregnancy   Acne:  Benzoyl Peroxide  Salicylic Acid   Backache/Headache:  Tylenol: 2 regular strength every 4 hours OR        2 Extra strength every 6 hours   Colds/Coughs/Allergies:  Benadryl (alcohol free) 25 mg every 6 hours as needed  Breath right strips  Claritin  Cepacol throat lozenges  Chloraseptic throat spray  Cold-Eeze- up to three times per day  Cough drops, alcohol free  Flonase (by prescription only)  Guaifenesin  Mucinex  Robitussin DM (plain only, alcohol free)  Saline nasal spray/drops  Sudafed (pseudoephedrine) & Actifed * use only after [redacted] weeks gestation and if you do not have high blood pressure  Tylenol  Vicks Vaporub  Zinc lozenges  Zyrtec   Constipation:  Colace  Ducolax suppositories  Fleet enema  Glycerin suppositories  Metamucil  Milk of magnesia  Miralax  Senokot  Smooth move tea   Diarrhea:  Kaopectate  Imodium A-D   *NO pepto Bismol   Hemorrhoids:  Anusol  Anusol HC  Preparation H  Tucks   Indigestion:  Tums  Maalox  Mylanta  Zantac  Pepcid   Insomnia:  Benadryl (alcohol free) 25mg  every 6 hours as needed  Tylenol PM  Unisom, no Gelcaps   Leg Cramps:  Tums  MagGel   Nausea/Vomiting:  Bonine  Dramamine  Emetrol  Ginger extract  Sea bands  Meclizine  Nausea medication to take during pregnancy:  Unisom (doxylamine succinate 25 mg tablets) Take one tablet daily at bedtime. If symptoms are not adequately controlled, the dose can be increased to a maximum recommended dose of two tablets daily (1/2 tablet in the morning, 1/2 tablet mid-afternoon and one at bedtime).  Vitamin B6 100mg  tablets. Take one tablet twice a day (up to 200 mg per day).    Skin Rashes:  Aveeno products  Benadryl cream or 25mg  every 6 hours as needed  Calamine Lotion  1% cortisone cream   Yeast infection:  Gyne-lotrimin 7  Monistat 7    **If taking multiple medications, please check labels to avoid duplicating the same active ingredients  **take medication as directed on the label  ** Do not exceed 4000 mg of tylenol in 24 hours  **Do not take medications that contain aspirin or ibuprofen

## 2018-04-20 NOTE — Progress Notes (Signed)
Mirza A Pennix-Walker is a 42 y.o. G3P1011 at Unknown here for routine follow up.  She did not get a dating ultrasound because she thought she was coming here to get it today.  She has not heard back about establishing at high risk OB for her pregnancy.  She reports heartburn and mild nausea. No headaches or blurry vision. Does note some swelling in her feet that is not new. See flow sheet for details.  FH palpated 1-2 cm below umbilicus FHR 147 bpm 1+ pitting edema  A/P: Pregnancy at Unknown.  Doing well.   Pregnancy issues include prediabetes, cHTN, AMA. Dating ultrasound was ordered. Scheduled for 04/30/17. Patient should get anatomy ultrasound after dating is back Referred to High risk OB.  Unable to obtain genetic screening due to lack of reliable dating.  Bleeding and pain precautions reviewed. Follow up with high risk OB clinic scheduled on 05/11/17  Leland HerElsia J Dakwan Pridgen, DO PGY-3, [facility] 04/20/2018 3:55 PM

## 2018-04-20 NOTE — Addendum Note (Signed)
Addended by: Henri MedalHARTSELL, JAZMIN M on: 04/20/2018 03:50 PM   Modules accepted: Orders

## 2018-04-30 ENCOUNTER — Other Ambulatory Visit (HOSPITAL_COMMUNITY): Payer: Self-pay | Admitting: Family Medicine

## 2018-04-30 ENCOUNTER — Ambulatory Visit (HOSPITAL_COMMUNITY)
Admission: RE | Admit: 2018-04-30 | Discharge: 2018-04-30 | Disposition: A | Discharge status: Home / Self Care | Payer: Medicaid Other | Source: Ambulatory Visit | Attending: Family Medicine | Admitting: Family Medicine

## 2018-04-30 ENCOUNTER — Ambulatory Visit (HOSPITAL_BASED_OUTPATIENT_CLINIC_OR_DEPARTMENT_OTHER)
Admission: RE | Admit: 2018-04-30 | Discharge: 2018-04-30 | Disposition: A | Discharge status: Home / Self Care | Payer: Medicaid Other | Source: Ambulatory Visit | Attending: Family Medicine | Admitting: Family Medicine

## 2018-04-30 ENCOUNTER — Other Ambulatory Visit (HOSPITAL_COMMUNITY): Payer: Self-pay | Admitting: *Deleted

## 2018-04-30 ENCOUNTER — Encounter (HOSPITAL_COMMUNITY): Payer: Self-pay

## 2018-04-30 DIAGNOSIS — Z363 Encounter for antenatal screening for malformations: Secondary | ICD-10-CM | POA: Diagnosis not present

## 2018-04-30 DIAGNOSIS — Z3686 Encounter for antenatal screening for cervical length: Secondary | ICD-10-CM | POA: Diagnosis not present

## 2018-04-30 DIAGNOSIS — O99212 Obesity complicating pregnancy, second trimester: Secondary | ICD-10-CM

## 2018-04-30 DIAGNOSIS — Z3490 Encounter for supervision of normal pregnancy, unspecified, unspecified trimester: Secondary | ICD-10-CM | POA: Diagnosis not present

## 2018-04-30 DIAGNOSIS — O09522 Supervision of elderly multigravida, second trimester: Secondary | ICD-10-CM | POA: Diagnosis not present

## 2018-04-30 DIAGNOSIS — O09529 Supervision of elderly multigravida, unspecified trimester: Secondary | ICD-10-CM

## 2018-04-30 DIAGNOSIS — O10012 Pre-existing essential hypertension complicating pregnancy, second trimester: Secondary | ICD-10-CM | POA: Diagnosis not present

## 2018-04-30 DIAGNOSIS — Z3A22 22 weeks gestation of pregnancy: Secondary | ICD-10-CM

## 2018-05-11 ENCOUNTER — Ambulatory Visit (INDEPENDENT_AMBULATORY_CARE_PROVIDER_SITE_OTHER): Payer: Medicaid Other | Admitting: Obstetrics and Gynecology

## 2018-05-11 ENCOUNTER — Encounter: Payer: Self-pay | Admitting: *Deleted

## 2018-05-11 ENCOUNTER — Encounter: Payer: Self-pay | Admitting: Obstetrics and Gynecology

## 2018-05-11 VITALS — BP 158/103 | HR 108 | Wt 235.9 lb

## 2018-05-11 DIAGNOSIS — O26899 Other specified pregnancy related conditions, unspecified trimester: Secondary | ICD-10-CM

## 2018-05-11 DIAGNOSIS — Z6841 Body Mass Index (BMI) 40.0 and over, adult: Secondary | ICD-10-CM

## 2018-05-11 DIAGNOSIS — O10919 Unspecified pre-existing hypertension complicating pregnancy, unspecified trimester: Secondary | ICD-10-CM | POA: Insufficient documentation

## 2018-05-11 DIAGNOSIS — O10912 Unspecified pre-existing hypertension complicating pregnancy, second trimester: Secondary | ICD-10-CM

## 2018-05-11 DIAGNOSIS — O09512 Supervision of elderly primigravida, second trimester: Secondary | ICD-10-CM | POA: Diagnosis not present

## 2018-05-11 DIAGNOSIS — Z3A23 23 weeks gestation of pregnancy: Secondary | ICD-10-CM

## 2018-05-11 DIAGNOSIS — Z6791 Unspecified blood type, Rh negative: Secondary | ICD-10-CM

## 2018-05-11 DIAGNOSIS — O0992 Supervision of high risk pregnancy, unspecified, second trimester: Secondary | ICD-10-CM

## 2018-05-11 DIAGNOSIS — Z8619 Personal history of other infectious and parasitic diseases: Secondary | ICD-10-CM

## 2018-05-11 DIAGNOSIS — O0990 Supervision of high risk pregnancy, unspecified, unspecified trimester: Secondary | ICD-10-CM

## 2018-05-11 DIAGNOSIS — O26892 Other specified pregnancy related conditions, second trimester: Secondary | ICD-10-CM

## 2018-05-11 LAB — POCT URINALYSIS DIP (DEVICE)
Glucose, UA: NEGATIVE mg/dL
Hgb urine dipstick: NEGATIVE
Ketones, ur: NEGATIVE mg/dL
Leukocytes, UA: NEGATIVE
Nitrite: NEGATIVE
PH: 6.5 (ref 5.0–8.0)
Protein, ur: 100 mg/dL — AB
Specific Gravity, Urine: >=1.03 (ref 1.005–1.030)
Urobilinogen, UA: 0.2 mg/dL (ref 0.0–1.0)

## 2018-05-11 MED ORDER — ASPIRIN EC 81 MG PO TBEC: 81.0000 mg | DELAYED_RELEASE_TABLET | Freq: Every day | ORAL | 2 refills | Status: DC

## 2018-05-11 MED ORDER — RANITIDINE HCL 150 MG PO CAPS: 150.0000 mg | ORAL_CAPSULE | Freq: Two times a day (BID) | ORAL | 2 refills | Status: DC

## 2018-05-11 NOTE — Progress Notes (Signed)
States unsure if she wants BTL or not; will delay decision until next visit.

## 2018-05-11 NOTE — Progress Notes (Signed)
Prenatal Visit Note Date: 05/11/2018 Clinic: [facility]  Transfer of care visit from [facility]  Subjective:  Lindsay Keith is a 43 y.o. G3P1011 at [redacted]w[redacted]d being seen today for ongoing prenatal care.  She is currently monitored for the following issues for this high-risk pregnancy and has History of herpes genitalis; Obesity in pregnancy; DEPRESSION, MAJOR; TOBACCO DEPENDENCE; ESSENTIAL HYPERTENSION, BENIGN; HEMORRHOIDS, NOS; RHINITIS, ALLERGIC; ASTHMA, INTERMITTENT; GASTROESOPHAGEAL REFLUX, NO ESOPHAGITIS; Supervision of high risk pregnancy, antepartum; Rh negative state in antepartum period; Chronic hypertension affecting pregnancy; and BMI 40.0-44.9, adult (HCC) on their problem list.  Patient reports no complaints.   Contractions: Not present. Vag. Bleeding: None.  Movement: Present. Denies leaking of fluid.   The following portions of the patient's history were reviewed and updated as appropriate: allergies, current medications, past family history, past medical history, past social history, past surgical history and problem list. Problem list updated.  Objective:   Vitals:   05/11/18 0909 05/11/18 0923  BP: (!) 154/123 (!) 158/103  Pulse: (!) 112 (!) 108  Weight: 235 lb 14.4 oz (107 kg)     Fetal Status: Fetal Heart Rate (bpm): 154   Movement: Present     General:  Alert, oriented and cooperative. Patient is in no acute distress.  Skin: Skin is warm and dry. No rash noted.   Cardiovascular: Normal heart rate noted  Respiratory: Normal respiratory effort, no problems with respiration noted  Abdomen: Soft, gravid, appropriate for gestational age. Pain/Pressure: Absent     Pelvic:  Cervical exam deferred        Extremities: Normal range of motion.  Edema: Trace  Mental Status: Normal mood and affect. Normal behavior. Normal judgment and thought content.   Urinalysis:      Assessment and Plan:  Pregnancy: G3P1011 at [redacted]w[redacted]d  1. Rh negative  state in antepartum period Rhogam at 28wks and PP PRN  2. Chronic hypertension affecting pregnancy Takes labetalol 100 in AM and qhs. She didn't take it today. No s/s of pre-eclampsia. bp only visit next week and may need to titrate but hard to say since didn't take it today. Already scheduled for rpt growth in a month. Start weekly testing at 32wks. Pt amenable to doing low dose asa  3. History of herpes genitalis Start ppx at 35-36wks  4. Supervision of high risk pregnancy, antepartum routien care. Pt declines to see GC formally but okay with just panorama today. Normal anatomy u/s. Pt unsure about btl. Ask at 28wks   Preterm labor symptoms and general obstetric precautions including but not limited to vaginal bleeding, contractions, leaking of fluid and fetal movement were reviewed in detail with the patient. Please refer to After Visit Summary for other counseling recommendations.  Return in about 1 week (around 05/18/2018) for 1wk bp rn visit. 2wk hrob.   [place] Bing, MD

## 2018-05-18 ENCOUNTER — Ambulatory Visit (INDEPENDENT_AMBULATORY_CARE_PROVIDER_SITE_OTHER): Payer: Medicaid Other | Admitting: *Deleted

## 2018-05-18 VITALS — BP 132/90 | HR 104 | Ht 61.0 in | Wt 241.0 lb

## 2018-05-18 DIAGNOSIS — Z0130 Encounter for examination of blood pressure without abnormal findings: Secondary | ICD-10-CM

## 2018-05-18 NOTE — Progress Notes (Signed)
Pt presents for BP check following elevated BPs at her previous prenatal appointment. Manual BP check was 132/90.  Pt asymptomatic.  Pt reports taking Labetalol BID as prescribed and reports she took her medication this morning.  Reviewed with Dr. Adrian Blackwater who instructed pt to continue taking Labetalol as prescribed and follow up at her next [facility] appointment. Pt verbalized understanding.   Osvaldo Human, RN

## 2018-05-19 NOTE — Progress Notes (Signed)
Chart reviewed - agree with RN documentation.   

## 2018-05-25 ENCOUNTER — Ambulatory Visit (INDEPENDENT_AMBULATORY_CARE_PROVIDER_SITE_OTHER): Payer: Medicaid Other | Admitting: Obstetrics and Gynecology

## 2018-05-25 ENCOUNTER — Encounter: Payer: Self-pay | Admitting: Obstetrics and Gynecology

## 2018-05-25 VITALS — BP 157/95 | HR 99 | Wt 239.8 lb

## 2018-05-25 DIAGNOSIS — O09522 Supervision of elderly multigravida, second trimester: Secondary | ICD-10-CM

## 2018-05-25 DIAGNOSIS — O0990 Supervision of high risk pregnancy, unspecified, unspecified trimester: Secondary | ICD-10-CM

## 2018-05-25 DIAGNOSIS — O0992 Supervision of high risk pregnancy, unspecified, second trimester: Secondary | ICD-10-CM

## 2018-05-25 DIAGNOSIS — O26899 Other specified pregnancy related conditions, unspecified trimester: Secondary | ICD-10-CM

## 2018-05-25 DIAGNOSIS — O26892 Other specified pregnancy related conditions, second trimester: Secondary | ICD-10-CM

## 2018-05-25 DIAGNOSIS — Z6791 Unspecified blood type, Rh negative: Secondary | ICD-10-CM

## 2018-05-25 DIAGNOSIS — Z8619 Personal history of other infectious and parasitic diseases: Secondary | ICD-10-CM

## 2018-05-25 DIAGNOSIS — O09529 Supervision of elderly multigravida, unspecified trimester: Secondary | ICD-10-CM | POA: Insufficient documentation

## 2018-05-25 DIAGNOSIS — O10912 Unspecified pre-existing hypertension complicating pregnancy, second trimester: Secondary | ICD-10-CM

## 2018-05-25 DIAGNOSIS — O10919 Unspecified pre-existing hypertension complicating pregnancy, unspecified trimester: Secondary | ICD-10-CM

## 2018-05-25 NOTE — Patient Instructions (Signed)

## 2018-05-25 NOTE — Progress Notes (Signed)
Subjective:  Lindsay Keith is a 43 y.o. G3P1011 at [redacted]w[redacted]d being seen today for ongoing prenatal care.  She is currently monitored for the following issues for this high-risk pregnancy and has History of herpes genitalis; Obesity in pregnancy; DEPRESSION, MAJOR; TOBACCO DEPENDENCE; ESSENTIAL HYPERTENSION, BENIGN; HEMORRHOIDS, NOS; RHINITIS, ALLERGIC; ASTHMA, INTERMITTENT; GASTROESOPHAGEAL REFLUX, NO ESOPHAGITIS; Supervision of high risk pregnancy, antepartum; Rh negative state in antepartum period; Chronic hypertension affecting pregnancy; BMI 40.0-44.9, adult (HCC); and AMA (advanced maternal age) multigravida 35+ on their problem list.  Patient reports no complaints.  Contractions: Not present. Vag. Bleeding: None.  Movement: Present. Denies leaking of fluid.   The following portions of the patient's history were reviewed and updated as appropriate: allergies, current medications, past family history, past medical history, past social history, past surgical history and problem list. Problem list updated.  Objective:   Vitals:   05/25/18 1328 05/25/18 1332  BP: (!) 158/105 (!) 157/95  Pulse: 99 99  Weight: 239 lb 12.8 oz (108.8 kg)     Fetal Status: Fetal Heart Rate (bpm): 154   Movement: Present     General:  Alert, oriented and cooperative. Patient is in no acute distress.  Skin: Skin is warm and dry. No rash noted.   Cardiovascular: Normal heart rate noted  Respiratory: Normal respiratory effort, no problems with respiration noted  Abdomen: Soft, gravid, appropriate for gestational age. Pain/Pressure: Absent     Pelvic:  Cervical exam deferred        Extremities: Normal range of motion.  Edema: Trace  Mental Status: Normal mood and affect. Normal behavior. Normal judgment and thought content.   Urinalysis:      Assessment and Plan:  Pregnancy: G3P1011 at [redacted]w[redacted]d  1. Supervision of high risk pregnancy, antepartum Stable Glucola next visit.  2. Chronic hypertension  affecting pregnancy BP elevated Will increase labetalol to 200 mg bid Continue with BASA Growth scan this week Note provided to pt allowing her to work  3. Multigravida of advanced maternal age in second trimester NIPS LR  4. Rh negative state in antepartum period   5. History of herpes genitalis Prophylactics at 36 weeks  Preterm labor symptoms and general obstetric precautions including but not limited to vaginal bleeding, contractions, leaking of fluid and fetal movement were reviewed in detail with the patient. Please refer to After Visit Summary for other counseling recommendations.  Return in about 3 weeks (around 06/15/2018) for OB visit.   Hermina Staggers, MD

## 2018-05-28 ENCOUNTER — Ambulatory Visit (HOSPITAL_COMMUNITY)
Admission: RE | Admit: 2018-05-28 | Discharge: 2018-05-28 | Disposition: A | Discharge status: Home / Self Care | Payer: Medicaid Other | Source: Ambulatory Visit | Attending: Obstetrics and Gynecology | Admitting: Obstetrics and Gynecology

## 2018-05-28 ENCOUNTER — Other Ambulatory Visit (HOSPITAL_COMMUNITY): Payer: Self-pay | Admitting: *Deleted

## 2018-05-28 ENCOUNTER — Encounter (HOSPITAL_COMMUNITY): Payer: Self-pay

## 2018-05-28 DIAGNOSIS — O10919 Unspecified pre-existing hypertension complicating pregnancy, unspecified trimester: Secondary | ICD-10-CM

## 2018-05-28 DIAGNOSIS — O10012 Pre-existing essential hypertension complicating pregnancy, second trimester: Secondary | ICD-10-CM

## 2018-05-28 DIAGNOSIS — O09522 Supervision of elderly multigravida, second trimester: Secondary | ICD-10-CM

## 2018-05-28 DIAGNOSIS — Z3A26 26 weeks gestation of pregnancy: Secondary | ICD-10-CM

## 2018-05-28 DIAGNOSIS — O09529 Supervision of elderly multigravida, unspecified trimester: Secondary | ICD-10-CM | POA: Insufficient documentation

## 2018-05-28 DIAGNOSIS — O99212 Obesity complicating pregnancy, second trimester: Secondary | ICD-10-CM

## 2018-05-28 DIAGNOSIS — Z362 Encounter for other antenatal screening follow-up: Secondary | ICD-10-CM | POA: Diagnosis not present

## 2018-06-08 ENCOUNTER — Telehealth: Payer: Self-pay

## 2018-06-08 DIAGNOSIS — O10919 Unspecified pre-existing hypertension complicating pregnancy, unspecified trimester: Secondary | ICD-10-CM

## 2018-06-08 NOTE — Telephone Encounter (Signed)
Pt called thru Nurse line today at 12:51p stating that she was told to call if she ran out of her Meds. Also her feet has been swelling for the last 4 days.Pt request call back at [phone].

## 2018-06-09 ENCOUNTER — Telehealth: Payer: Self-pay

## 2018-06-09 MED ORDER — LABETALOL HCL 100 MG PO TABS: 200.0000 mg | ORAL_TABLET | Freq: Two times a day (BID) | ORAL | 2 refills | Status: DC

## 2018-06-09 NOTE — Addendum Note (Signed)
Addended by: Hermina Staggers on: 06/09/2018 10:13 AM   Modules accepted: Orders

## 2018-06-09 NOTE — Telephone Encounter (Signed)
Called pt to advise that Dr. Alysia Penna did increase BP meds and sent to pharmacy on file. No answer, left VM.

## 2018-06-15 ENCOUNTER — Other Ambulatory Visit: Payer: Medicaid Other

## 2018-06-15 ENCOUNTER — Ambulatory Visit (INDEPENDENT_AMBULATORY_CARE_PROVIDER_SITE_OTHER): Payer: Medicaid Other | Admitting: Family Medicine

## 2018-06-15 VITALS — BP 153/119 | HR 105 | Wt 244.8 lb

## 2018-06-15 DIAGNOSIS — O10919 Unspecified pre-existing hypertension complicating pregnancy, unspecified trimester: Secondary | ICD-10-CM

## 2018-06-15 DIAGNOSIS — O0990 Supervision of high risk pregnancy, unspecified, unspecified trimester: Secondary | ICD-10-CM

## 2018-06-15 DIAGNOSIS — Z6791 Unspecified blood type, Rh negative: Secondary | ICD-10-CM

## 2018-06-15 DIAGNOSIS — O26893 Other specified pregnancy related conditions, third trimester: Secondary | ICD-10-CM | POA: Diagnosis not present

## 2018-06-15 DIAGNOSIS — O09522 Supervision of elderly multigravida, second trimester: Secondary | ICD-10-CM

## 2018-06-15 DIAGNOSIS — O26899 Other specified pregnancy related conditions, unspecified trimester: Secondary | ICD-10-CM

## 2018-06-15 DIAGNOSIS — Z8619 Personal history of other infectious and parasitic diseases: Secondary | ICD-10-CM

## 2018-06-15 DIAGNOSIS — O09523 Supervision of elderly multigravida, third trimester: Secondary | ICD-10-CM | POA: Diagnosis not present

## 2018-06-15 DIAGNOSIS — O10913 Unspecified pre-existing hypertension complicating pregnancy, third trimester: Secondary | ICD-10-CM | POA: Diagnosis not present

## 2018-06-15 DIAGNOSIS — O0993 Supervision of high risk pregnancy, unspecified, third trimester: Secondary | ICD-10-CM | POA: Diagnosis not present

## 2018-06-15 DIAGNOSIS — O360930 Maternal care for other rhesus isoimmunization, third trimester, not applicable or unspecified: Secondary | ICD-10-CM

## 2018-06-15 DIAGNOSIS — Z6841 Body Mass Index (BMI) 40.0 and over, adult: Secondary | ICD-10-CM

## 2018-06-15 LAB — POCT URINALYSIS DIP (DEVICE)
GLUCOSE, UA: NEGATIVE mg/dL
Hgb urine dipstick: NEGATIVE
KETONES UR: NEGATIVE mg/dL
Leukocytes,Ua: NEGATIVE
Nitrite: NEGATIVE
Protein, ur: 30 mg/dL — AB
Specific Gravity, Urine: 1.025 (ref 1.005–1.030)
Urobilinogen, UA: 1 mg/dL (ref 0.0–1.0)
pH: 7 (ref 5.0–8.0)

## 2018-06-15 MED ORDER — WRIST SPLINT/COCK-UP/LEFT M MISC: 1.0000 [IU] | Freq: Every day | 0 refills | Status: DC

## 2018-06-15 MED ORDER — RHO D IMMUNE GLOBULIN 1500 UNIT/2ML IJ SOSY
300.0000 ug | PREFILLED_SYRINGE | Freq: Once | INTRAMUSCULAR | Status: AC
  Administered 2018-06-15: 300 ug via INTRAMUSCULAR

## 2018-06-15 MED ORDER — WRIST SPLINT/COCK-UP/RIGHT M MISC: 1.0000 [IU] | Freq: Every day | 0 refills | Status: DC

## 2018-06-15 NOTE — Addendum Note (Signed)
Addended by: Gwendlyn Deutscher A on: 06/15/2018 10:33 AM   Modules accepted: Orders

## 2018-06-15 NOTE — Progress Notes (Signed)
   PRENATAL VISIT NOTE  Subjective:  Lindsay Keith is a 43 y.o. G3P1011 at [redacted]w[redacted]d being seen today for ongoing prenatal care.  She is currently monitored for the following issues for this high-risk pregnancy and has History of herpes genitalis; Obesity in pregnancy; DEPRESSION, MAJOR; TOBACCO DEPENDENCE; ESSENTIAL HYPERTENSION, BENIGN; HEMORRHOIDS, NOS; RHINITIS, ALLERGIC; ASTHMA, INTERMITTENT; GASTROESOPHAGEAL REFLUX, NO ESOPHAGITIS; Supervision of high risk pregnancy, antepartum; Rh negative state in antepartum period; Chronic hypertension affecting pregnancy; BMI 40.0-44.9, adult (HCC); and AMA (advanced maternal age) multigravida 35+ on their problem list.  Patient reports hands swelling and numbness.  Contractions: Not present. Vag. Bleeding: None.  Movement: Present. Denies leaking of fluid.   The following portions of the patient's history were reviewed and updated as appropriate: allergies, current medications, past family history, past medical history, past social history, past surgical history and problem list. Problem list updated.  Objective:   Vitals:   06/15/18 0829  BP: (!) 153/119  Pulse: (!) 105  Weight: 244 lb 12.8 oz (111 kg)    Fetal Status: Fetal Heart Rate (bpm): 154   Movement: Present     General:  Alert, oriented and cooperative. Patient is in no acute distress.  Skin: Skin is warm and dry. No rash noted.   Cardiovascular: Normal heart rate noted  Respiratory: Normal respiratory effort, no problems with respiration noted  Abdomen: Soft, gravid, appropriate for gestational age.  Pain/Pressure: Absent     Pelvic: Cervical exam deferred        Extremities: Normal range of motion.  Edema: Trace  Mental Status: Normal mood and affect. Normal behavior. Normal judgment and thought content.   Assessment and Plan:  Pregnancy: G3P1011 at [redacted]w[redacted]d  1. Supervision of high risk pregnancy, antepartum FHT normal  2. Chronic hypertension affecting pregnancy BP  elevated, but didn't take BP meds this AM due to fasting glucola. Patient took BP meds in office with a sip of water. Next Growth [place] 2/28.  3. Multigravida of advanced maternal age in second trimester Low risk NIPS  4. Rh negative state in antepartum period Rhogam today  5. History of herpes genitalis Valtrex at 35 weeks  6. BMI 40.0-44.9, adult (HCC)   Preterm labor symptoms and general obstetric precautions including but not limited to vaginal bleeding, contractions, leaking of fluid and fetal movement were reviewed in detail with the patient. Please refer to After Visit Summary for other counseling recommendations.  No follow-ups on file.  Future Appointments  Date Time Provider Department Center  06/26/2018 10:30 AM [facility] [place] 5 [facility]    Levie Heritage, DO

## 2018-06-16 LAB — RPR: RPR Ser Ql: NONREACTIVE

## 2018-06-16 LAB — GLUCOSE TOLERANCE, 2 HOURS W/ 1HR
Glucose, 1 hour: 262 mg/dL — ABNORMAL HIGH (ref 65–179)
Glucose, 2 hour: 201 mg/dL — ABNORMAL HIGH (ref 65–152)
Glucose, Fasting: 110 mg/dL — ABNORMAL HIGH (ref 65–91)

## 2018-06-16 LAB — HIV ANTIBODY (ROUTINE TESTING W REFLEX): HIV Screen 4th Generation wRfx: NONREACTIVE

## 2018-06-16 LAB — ANTIBODY SCREEN: Antibody Screen: NEGATIVE

## 2018-06-16 LAB — CBC
HEMOGLOBIN: 12.3 g/dL (ref 11.1–15.9)
Hematocrit: 37.1 % (ref 34.0–46.6)
MCH: 29.6 pg (ref 26.6–33.0)
MCHC: 33.2 g/dL (ref 31.5–35.7)
MCV: 89 fL (ref 79–97)
Platelets: 303 10*3/uL (ref 150–450)
RBC: 4.15 x10E6/uL (ref 3.77–5.28)
RDW: 12.5 % (ref 11.7–15.4)
WBC: 14.8 10*3/uL — ABNORMAL HIGH (ref 3.4–10.8)

## 2018-06-18 ENCOUNTER — Telehealth: Payer: Self-pay | Admitting: *Deleted

## 2018-06-18 ENCOUNTER — Other Ambulatory Visit: Payer: Self-pay | Admitting: Family Medicine

## 2018-06-18 ENCOUNTER — Encounter: Payer: Self-pay | Admitting: Family Medicine

## 2018-06-18 DIAGNOSIS — Z8632 Personal history of gestational diabetes: Secondary | ICD-10-CM | POA: Insufficient documentation

## 2018-06-18 DIAGNOSIS — O24419 Gestational diabetes mellitus in pregnancy, unspecified control: Secondary | ICD-10-CM

## 2018-06-18 HISTORY — DX: Gestational diabetes mellitus in pregnancy, unspecified control: O24.419

## 2018-06-18 MED ORDER — ACCU-CHEK FASTCLIX LANCETS MISC: 1.0000 [IU] | Freq: Four times a day (QID) | 12 refills | Status: DC

## 2018-06-18 MED ORDER — GLUCOSE BLOOD VI STRP: ORAL_STRIP | 12 refills | Status: DC

## 2018-06-18 MED ORDER — ACCU-CHEK NANO SMARTVIEW W/DEVICE KIT: 1.0000 | PACK | 0 refills | Status: DC

## 2018-06-18 NOTE — Telephone Encounter (Signed)
I messaged front office to schedule diabetes education asap. I have called Lindsay Keith and left a message I am calling with some non-urgent information and to schedule an appointment- please call office to discuss.

## 2018-06-18 NOTE — Telephone Encounter (Signed)
Called pt and advised her of the diabetes education on 06/30/2018. Pt verbalized understanding and stated she would be there. Had no questions and verbalized understanding.

## 2018-06-18 NOTE — Telephone Encounter (Signed)
-----   Message from Levie Heritage, DO sent at 06/18/2018  8:30 AM EST ----- Failed 2hr GTT. Has GDM. Please arrange diabetes education. Supplies sent to pharmacy.

## 2018-06-26 ENCOUNTER — Ambulatory Visit (HOSPITAL_COMMUNITY)
Admission: RE | Admit: 2018-06-26 | Discharge: 2018-06-26 | Disposition: A | Discharge status: Home / Self Care | Payer: Medicaid Other | Source: Ambulatory Visit | Attending: Obstetrics and Gynecology | Admitting: Obstetrics and Gynecology

## 2018-06-26 ENCOUNTER — Encounter (HOSPITAL_COMMUNITY): Payer: Self-pay

## 2018-06-26 ENCOUNTER — Ambulatory Visit (HOSPITAL_COMMUNITY): Payer: Medicaid Other | Admitting: *Deleted

## 2018-06-26 ENCOUNTER — Other Ambulatory Visit (HOSPITAL_COMMUNITY): Payer: Self-pay | Admitting: *Deleted

## 2018-06-26 VITALS — BP 157/99 | HR 93 | Wt 244.4 lb

## 2018-06-26 DIAGNOSIS — O10919 Unspecified pre-existing hypertension complicating pregnancy, unspecified trimester: Secondary | ICD-10-CM | POA: Diagnosis not present

## 2018-06-26 DIAGNOSIS — Z3A30 30 weeks gestation of pregnancy: Secondary | ICD-10-CM

## 2018-06-26 DIAGNOSIS — O09522 Supervision of elderly multigravida, second trimester: Secondary | ICD-10-CM

## 2018-06-26 DIAGNOSIS — O99212 Obesity complicating pregnancy, second trimester: Secondary | ICD-10-CM

## 2018-06-26 DIAGNOSIS — O09529 Supervision of elderly multigravida, unspecified trimester: Secondary | ICD-10-CM

## 2018-06-26 DIAGNOSIS — O10019 Pre-existing essential hypertension complicating pregnancy, unspecified trimester: Secondary | ICD-10-CM

## 2018-06-29 ENCOUNTER — Encounter (HOSPITAL_COMMUNITY): Payer: Self-pay | Admitting: *Deleted

## 2018-06-29 ENCOUNTER — Ambulatory Visit (INDEPENDENT_AMBULATORY_CARE_PROVIDER_SITE_OTHER): Payer: Medicaid Other | Admitting: Family Medicine

## 2018-06-29 ENCOUNTER — Other Ambulatory Visit: Payer: Self-pay

## 2018-06-29 ENCOUNTER — Inpatient Hospital Stay (HOSPITAL_COMMUNITY)
Admission: AD | Admit: 2018-06-29 | Discharge: 2018-06-29 | Disposition: A | Discharge status: Home / Self Care | Payer: Medicaid Other | Attending: Obstetrics and Gynecology | Admitting: Obstetrics and Gynecology

## 2018-06-29 ENCOUNTER — Inpatient Hospital Stay (HOSPITAL_BASED_OUTPATIENT_CLINIC_OR_DEPARTMENT_OTHER): Payer: Medicaid Other

## 2018-06-29 VITALS — BP 165/115 | HR 94 | Wt 248.2 lb

## 2018-06-29 DIAGNOSIS — O09522 Supervision of elderly multigravida, second trimester: Secondary | ICD-10-CM

## 2018-06-29 DIAGNOSIS — O99213 Obesity complicating pregnancy, third trimester: Secondary | ICD-10-CM

## 2018-06-29 DIAGNOSIS — O26899 Other specified pregnancy related conditions, unspecified trimester: Secondary | ICD-10-CM

## 2018-06-29 DIAGNOSIS — O10013 Pre-existing essential hypertension complicating pregnancy, third trimester: Secondary | ICD-10-CM | POA: Insufficient documentation

## 2018-06-29 DIAGNOSIS — O24419 Gestational diabetes mellitus in pregnancy, unspecified control: Secondary | ICD-10-CM

## 2018-06-29 DIAGNOSIS — O26893 Other specified pregnancy related conditions, third trimester: Secondary | ICD-10-CM

## 2018-06-29 DIAGNOSIS — O163 Unspecified maternal hypertension, third trimester: Secondary | ICD-10-CM

## 2018-06-29 DIAGNOSIS — O09523 Supervision of elderly multigravida, third trimester: Secondary | ICD-10-CM | POA: Diagnosis not present

## 2018-06-29 DIAGNOSIS — Z3A30 30 weeks gestation of pregnancy: Secondary | ICD-10-CM | POA: Insufficient documentation

## 2018-06-29 DIAGNOSIS — Z8619 Personal history of other infectious and parasitic diseases: Secondary | ICD-10-CM

## 2018-06-29 DIAGNOSIS — O10919 Unspecified pre-existing hypertension complicating pregnancy, unspecified trimester: Secondary | ICD-10-CM

## 2018-06-29 DIAGNOSIS — O10913 Unspecified pre-existing hypertension complicating pregnancy, third trimester: Secondary | ICD-10-CM

## 2018-06-29 DIAGNOSIS — Z6841 Body Mass Index (BMI) 40.0 and over, adult: Secondary | ICD-10-CM

## 2018-06-29 DIAGNOSIS — O368330 Maternal care for abnormalities of the fetal heart rate or rhythm, third trimester, not applicable or unspecified: Secondary | ICD-10-CM | POA: Insufficient documentation

## 2018-06-29 DIAGNOSIS — O169 Unspecified maternal hypertension, unspecified trimester: Secondary | ICD-10-CM

## 2018-06-29 DIAGNOSIS — Z6791 Unspecified blood type, Rh negative: Secondary | ICD-10-CM

## 2018-06-29 DIAGNOSIS — O0990 Supervision of high risk pregnancy, unspecified, unspecified trimester: Secondary | ICD-10-CM

## 2018-06-29 DIAGNOSIS — O368390 Maternal care for abnormalities of the fetal heart rate or rhythm, unspecified trimester, not applicable or unspecified: Secondary | ICD-10-CM

## 2018-06-29 DIAGNOSIS — O0993 Supervision of high risk pregnancy, unspecified, third trimester: Secondary | ICD-10-CM

## 2018-06-29 LAB — CBC WITH DIFFERENTIAL/PLATELET
Abs Immature Granulocytes: 0.07 10*3/uL (ref 0.00–0.07)
Basophils Absolute: 0.1 10*3/uL (ref 0.0–0.1)
Basophils Relative: 0 %
Eosinophils Absolute: 0.3 10*3/uL (ref 0.0–0.5)
Eosinophils Relative: 2 %
HCT: 39.9 % (ref 36.0–46.0)
Hemoglobin: 12.7 g/dL (ref 12.0–15.0)
Immature Granulocytes: 1 %
Lymphocytes Relative: 24 %
Lymphs Abs: 3.6 10*3/uL (ref 0.7–4.0)
MCH: 30 pg (ref 26.0–34.0)
MCHC: 31.8 g/dL (ref 30.0–36.0)
MCV: 94.3 fL (ref 80.0–100.0)
MONO ABS: 0.7 10*3/uL (ref 0.1–1.0)
Monocytes Relative: 5 %
Neutro Abs: 10.2 10*3/uL — ABNORMAL HIGH (ref 1.7–7.7)
Neutrophils Relative %: 68 %
Platelets: 301 10*3/uL (ref 150–400)
RBC: 4.23 MIL/uL (ref 3.87–5.11)
RDW: 13.2 % (ref 11.5–15.5)
WBC: 14.9 10*3/uL — AB (ref 4.0–10.5)
nRBC: 0 % (ref 0.0–0.2)

## 2018-06-29 LAB — URINALYSIS, ROUTINE W REFLEX MICROSCOPIC
BACTERIA UA: NONE SEEN
Bilirubin Urine: NEGATIVE
Glucose, UA: >=500 mg/dL — AB
Hgb urine dipstick: NEGATIVE
Ketones, ur: NEGATIVE mg/dL
Leukocytes,Ua: NEGATIVE
NITRITE: NEGATIVE
Protein, ur: NEGATIVE mg/dL
Specific Gravity, Urine: 1.021 (ref 1.005–1.030)
pH: 7 (ref 5.0–8.0)

## 2018-06-29 LAB — COMPREHENSIVE METABOLIC PANEL
ALT: 13 U/L (ref 0–44)
AST: 23 U/L (ref 15–41)
Albumin: 2.6 g/dL — ABNORMAL LOW (ref 3.5–5.0)
Alkaline Phosphatase: 85 U/L (ref 38–126)
Anion gap: 8 (ref 5–15)
BUN: 8 mg/dL (ref 6–20)
CO2: 21 mmol/L — ABNORMAL LOW (ref 22–32)
Calcium: 9.4 mg/dL (ref 8.9–10.3)
Chloride: 104 mmol/L (ref 98–111)
Creatinine, Ser: 0.64 mg/dL (ref 0.44–1.00)
GFR calc Af Amer: >60 mL/min (ref >60)
GFR calc non Af Amer: >60 mL/min (ref >60)
Glucose, Bld: 179 mg/dL — ABNORMAL HIGH (ref 70–99)
Potassium: 4.9 mmol/L (ref 3.5–5.1)
Sodium: 133 mmol/L — ABNORMAL LOW (ref 135–145)
TOTAL PROTEIN: 5.6 g/dL — AB (ref 6.5–8.1)
Total Bilirubin: 1 mg/dL (ref 0.3–1.2)

## 2018-06-29 LAB — POCT URINALYSIS DIP (DEVICE)
Bilirubin Urine: NEGATIVE
Glucose, UA: NEGATIVE mg/dL
Hgb urine dipstick: NEGATIVE
Ketones, ur: NEGATIVE mg/dL
Leukocytes,Ua: NEGATIVE
Nitrite: NEGATIVE
Protein, ur: 30 mg/dL — AB
Specific Gravity, Urine: 1.025 (ref 1.005–1.030)
Urobilinogen, UA: 0.2 mg/dL (ref 0.0–1.0)
pH: 7 (ref 5.0–8.0)

## 2018-06-29 LAB — PROTEIN / CREATININE RATIO, URINE
Creatinine, Urine: 115.08 mg/dL
Protein Creatinine Ratio: 0.25 mg/mg{Cre} — ABNORMAL HIGH (ref 0.00–0.15)
Total Protein, Urine: 29 mg/dL

## 2018-06-29 LAB — GLUCOSE, CAPILLARY: Glucose-Capillary: 128 mg/dL — ABNORMAL HIGH (ref 70–99)

## 2018-06-29 MED ORDER — LABETALOL HCL 5 MG/ML IV SOLN
20.0000 mg | INTRAVENOUS | Status: DC | PRN
  Administered 2018-06-29: 20 mg via INTRAVENOUS
  Filled 2018-06-29: qty 4

## 2018-06-29 MED ORDER — LABETALOL HCL 5 MG/ML IV SOLN: 20.0000 mg | INTRAVENOUS | Status: DC | PRN

## 2018-06-29 MED ORDER — LABETALOL HCL 5 MG/ML IV SOLN: 40.0000 mg | INTRAVENOUS | Status: DC | PRN

## 2018-06-29 MED ORDER — LABETALOL HCL 5 MG/ML IV SOLN: 80.0000 mg | INTRAVENOUS | Status: DC | PRN

## 2018-06-29 MED ORDER — LACTATED RINGERS IV SOLN
INTRAVENOUS | Status: DC
  Administered 2018-06-29: 13:00:00 via INTRAVENOUS

## 2018-06-29 MED ORDER — HYDRALAZINE HCL 20 MG/ML IJ SOLN: 10.0000 mg | INTRAMUSCULAR | Status: DC | PRN

## 2018-06-29 MED ORDER — HYDRALAZINE HCL 20 MG/ML IJ SOLN
10.0000 mg | INTRAMUSCULAR | Status: DC | PRN
  Administered 2018-06-29: 10 mg via INTRAVENOUS
  Filled 2018-06-29: qty 1

## 2018-06-29 MED ORDER — LABETALOL HCL 100 MG PO TABS
200.0000 mg | ORAL_TABLET | Freq: Once | ORAL | Status: AC
  Administered 2018-06-29: 200 mg via ORAL
  Filled 2018-06-29: qty 2

## 2018-06-29 MED ORDER — LABETALOL HCL 200 MG PO TABS: 400.0000 mg | ORAL_TABLET | Freq: Two times a day (BID) | ORAL | 0 refills | Status: DC

## 2018-06-29 NOTE — MAU Provider Note (Addendum)
History     CSN: [id number]  Arrival date and time: 06/29/18 1141   None     Chief Complaint  Patient presents with  . Hypertension   Ms. Wengert, 43yo F, at [redacted]w[redacted]d G3P1011 presents to MAU for evaluation of pre-eclampsia. [facility] in office this morning was 165/115.  Pt states she is currently taking 2 pills of labetalol BID and did take her medication as prescribed this morning. Pt's OB provider reports inconsistent use of PO labetalol and reports pt is not taking consistently. Pt has a hx of cHTN, GDM, AMA, PRO in urine x3wks. Pt reports the reason she was sent here today was for significant leg swelling and swelling in her hands. Pt denies facial swelling, HA, spots/vision changes, abdominal pain or N/V. Pt states she feels well other than swelling.   OB History    Gravida  3   Para  1   Term  1   Preterm  0   AB  1   Living  1     SAB  1   TAB  0   Ectopic  0   Multiple  0   Live Births  1           Past Medical History:  Diagnosis Date  . Asthma   . Bartholin's gland abscess 02/27/2011  . Depression   . GERD (gastroesophageal reflux disease)   . Gestational diabetes mellitus 06/18/2018  . Hypertension     Past Surgical History:  Procedure Laterality Date  . WISDOM TOOTH EXTRACTION      No family history on file.  Social History   Tobacco Use  . Smoking status: Current Every Day Smoker    Packs/day: 0.25  . Smokeless tobacco: Never Used  Substance Use Topics  . Alcohol use: Not Currently  . Drug use: Yes    Types: Marijuana    Comment: Last marijuana Feb 2020    Allergies: No Known Allergies  Medications Prior to Admission  Medication Sig Dispense Refill Last Dose  . ACCU-CHEK FASTCLIX LANCETS MISC 1 Units by Percutaneous route 4 (four) times daily. 100 each 12   . aspirin EC 81 MG tablet Take 1 tablet (81 mg total) by mouth daily. 60 tablet 2 Taking  . Blood Glucose  Monitoring Suppl (ACCU-CHEK NANO SMARTVIEW) w/Device KIT 1 kit by Subdermal route as directed. Check blood sugars for fasting, and two hours after breakfast, lunch and dinner (4 checks daily) 1 kit 0   . Elastic Bandages & Supports (WRIST SPLINT/COCK-UP/LEFT M) MISC 1 Units by Does not apply route at bedtime. (Patient not taking: Reported on 06/29/2018) 1 each 0 Not Taking  . Elastic Bandages & Supports (WRIST SPLINT/COCK-UP/RIGHT M) MISC 1 Units by Does not apply route at bedtime. (Patient not taking: Reported on 06/29/2018) 1 each 0 Not Taking  . famotidine (PEPCID) 20 MG tablet Take 1 tablet (20 mg total) by mouth 2 (two) times daily. (Patient not taking: Reported on 05/25/2018) 60 tablet 0 Not Taking  . glucose blood (ACCU-CHEK SMARTVIEW) test strip Use as instructed to check blood sugars 100 each 12   . labetalol (NORMODYNE) 100 MG tablet Take 2 tablets (200 mg total) by mouth 2 (two) times daily. 60 tablet 2 Taking  . Prenatal Vit-Fe Fumarate-FA (PRENATAL MULTIVITAMIN) TABS tablet Take 1 tablet by mouth daily at 12 noon.   Taking  . ranitidine (ZANTAC) 150 MG capsule Take 1 capsule (150  mg total) by mouth 2 (two) times daily. 60 capsule 2 Taking    Review of Systems  Eyes: Negative for visual disturbance.  Gastrointestinal: Negative for abdominal pain, nausea and vomiting.  Neurological: Negative for dizziness and light-headedness.   Physical Exam   Blood pressure 131/71, pulse 97, resp. rate 18, height 5' 1" (1.549 m), weight 113.9 kg, last menstrual period 10/15/2017, SpO2 98 %.  Patient Vitals for the past 24 hrs:  BP Pulse Resp SpO2 Height Weight  06/29/18 1708 131/71 97 - - - -  06/29/18 1616 (!) 139/98 (!) 108 - - - -  06/29/18 1601 (!) 143/101 (!) 103 - - - -  06/29/18 1546 (!) 153/82 (!) 111 - - - -  06/29/18 1531 (!) 135/94 (!) 107 - - - -  06/29/18 1516 (!) 159/86 (!) 108 - - - -  06/29/18 1501 (!) 147/94 (!) 108 - - - -  06/29/18 1450 140/79 (!) 105 - - - -  06/29/18 1431 (!)  139/95 98 - - - -  06/29/18 1416 (!) 147/96 100 - - - -  06/29/18 1401 136/88 98 - - - -  06/29/18 1346 95/81 (!) 210 - - - -  06/29/18 1332 124/62 99 - - - -  06/29/18 1316 (!) 163/93 99 - - - -  06/29/18 1301 (!) 151/92 95 - - - -  06/29/18 1246 (!) 151/98 98 - - - -  06/29/18 1231 (!) 185/127 95 - - - -  06/29/18 1217 (!) 171/113 95 - - - -  06/29/18 1203 (!) 151/103 88 18 98 % 5' 1" (1.549 m) 113.9 kg     Physical Exam  Constitutional: She is oriented to person, place, and time. She appears well-developed and well-nourished. No distress.  Neurological: She is alert and oriented to person, place, and time. She has normal reflexes.  Negative clonus  Skin: Skin is warm and dry. She is not diaphoretic.  Psychiatric: She has a normal mood and affect. Her behavior is normal.  Bilateral, pedal pitting edema, right 2+, left 1+. Bilateral edema of hands, non-pitting.  Results for orders placed or performed during the hospital encounter of 06/29/18 (from the past 24 hour(s))  Urinalysis, Routine w reflex microscopic     Status: Abnormal   Collection Time: 06/29/18 12:13 PM  Result Value Ref Range   Color, Urine YELLOW YELLOW   APPearance CLEAR CLEAR   Specific Gravity, Urine 1.021 1.005 - 1.030   pH 7.0 5.0 - 8.0   Glucose, UA >=500 (A) NEGATIVE mg/dL   Hgb urine dipstick NEGATIVE NEGATIVE   Bilirubin Urine NEGATIVE NEGATIVE   Ketones, ur NEGATIVE NEGATIVE mg/dL   Protein, ur NEGATIVE NEGATIVE mg/dL   Nitrite NEGATIVE NEGATIVE   Leukocytes,Ua NEGATIVE NEGATIVE   RBC / HPF 0-5 0 - 5 RBC/hpf   WBC, UA 0-5 0 - 5 WBC/hpf   Bacteria, UA NONE SEEN NONE SEEN   Squamous Epithelial / LPF 6-10 0 - 5   Mucus PRESENT   Protein / creatinine ratio, urine     Status: Abnormal   Collection Time: 06/29/18 12:13 PM  Result Value Ref Range   Creatinine, Urine 115.08 mg/dL   Total Protein, Urine 29 mg/dL   Protein Creatinine Ratio 0.25 (H) 0.00 - 0.15 mg/mg[Cre]  Comprehensive metabolic panel      Status: Abnormal   Collection Time: 06/29/18 12:22 PM  Result Value Ref Range   Sodium 133 (L) 135 - 145 mmol/L  Potassium 4.9 3.5 - 5.1 mmol/L   Chloride 104 98 - 111 mmol/L   CO2 21 (L) 22 - 32 mmol/L   Glucose, Bld 179 (H) 70 - 99 mg/dL   BUN 8 6 - 20 mg/dL   Creatinine, Ser 0.64 0.44 - 1.00 mg/dL   Calcium 9.4 8.9 - 10.3 mg/dL   Total Protein 5.6 (L) 6.5 - 8.1 g/dL   Albumin 2.6 (L) 3.5 - 5.0 g/dL   AST 23 15 - 41 U/L   ALT 13 0 - 44 U/L   Alkaline Phosphatase 85 38 - 126 U/L   Total Bilirubin 1.0 0.3 - 1.2 mg/dL   GFR calc non Af Amer >60 >60 mL/min   GFR calc Af Amer >60 >60 mL/min   Anion gap 8 5 - 15  CBC with Differential     Status: Abnormal   Collection Time: 06/29/18 12:22 PM  Result Value Ref Range   WBC 14.9 (H) 4.0 - 10.5 K/uL   RBC 4.23 3.87 - 5.11 MIL/uL   Hemoglobin 12.7 12.0 - 15.0 g/dL   HCT 39.9 36.0 - 46.0 %   MCV 94.3 80.0 - 100.0 fL   MCH 30.0 26.0 - 34.0 pg   MCHC 31.8 30.0 - 36.0 g/dL   RDW 13.2 11.5 - 15.5 %   Platelets 301 150 - 400 K/uL   nRBC 0.0 0.0 - 0.2 %   Neutrophils Relative % 68 %   Neutro Abs 10.2 (H) 1.7 - 7.7 K/uL   Lymphocytes Relative 24 %   Lymphs Abs 3.6 0.7 - 4.0 K/uL   Monocytes Relative 5 %   Monocytes Absolute 0.7 0.1 - 1.0 K/uL   Eosinophils Relative 2 %   Eosinophils Absolute 0.3 0.0 - 0.5 K/uL   Basophils Relative 0 %   Basophils Absolute 0.1 0.0 - 0.1 K/uL   Immature Granulocytes 1 %   Abs Immature Granulocytes 0.07 0.00 - 0.07 K/uL  Glucose, capillary     Status: Abnormal   Collection Time: 06/29/18  2:20 PM  Result Value Ref Range   Glucose-Capillary 128 (H) 70 - 99 mg/dL    [place] Mfm Ob Follow Up  Result Date: 06/26/2018 ----------------------------------------------------------------------  OBSTETRICS REPORT                         (Signed Final 06/26/2018 11:40 am) ---------------------------------------------------------------------- Patient Info  ID #:        [id number]                          D.O.B.:   01-Oct-1975 (42 yrs)  Name:        Matthias Hughs PENNIX-               Visit Date: 06/26/2018 11:22 am               WALKER ---------------------------------------------------------------------- Performed By  Performed By:      Wilnette Kales        Ref. Address:      [address] [place]  Attending:         Sander Nephew  [facility]                     MD                                        [facility]etal [facility]  Referred By:       [facility] ---------------------------------------------------------------------- Orders   #  Description                           Code         Ordered By   1  [place] MFM OB FOLLOW UP                   [phone]     [facility]  ----------------------------------------------------------------------   #  Order #                     Accession #                 Episode #   1  [id number]                   [id number]                  [id number]  ---------------------------------------------------------------------- Indications   Advanced maternal age multigravida 64,          O09.522   second trimester   Obesity complicating pregnancy, second          O99.212   trimester   Hypertension - Chronic/Pre-existing (labetalol) O10.019   [redacted] weeks gestation of pregnancy                 Z3A.30  ---------------------------------------------------------------------- Vital Signs                                                  Height:        5'1" ---------------------------------------------------------------------- Fetal Evaluation  Num Of Fetuses:          1  Fetal Heart Rate(bpm):   155  Cardiac Activity:        Observed  Presentation:            Cephalic  Placenta:                Anterior  Amniotic Fluid  AFI FV:      Within normal limits  AFI Sum(cm)     %Tile       Largest Pocket(cm)  9.23            8           3.78  RUQ(cm)       RLQ(cm)        LUQ(cm)        LLQ(cm)  1.8           2.05           1.6             3.78 ---------------------------------------------------------------------- Biometry  BPD:      69.7   mm     G. Age:  28w 0d        <  1  %    CI:          69.8  %    70 - 86                                                           FL/HC:       20.5  %    19.2 - 21.4  HC:      266.2   mm     G. Age:  29w 0d        < 3  %    HC/AC:       1.04       0.99 - 1.21  AC:      256.9   mm     G. Age:  29w 6d         29  %    FL/BPD:      78.2  %    71 - 87  FL:       54.5   mm     G. Age:  28w 6d          6  %    FL/AC:       21.2  %    20 - 24  Est. FW:    1369   gm          3 lb      30  % ---------------------------------------------------------------------- OB History  Gravidity:     3         Term:  1          Prem:  0        SAB:   1  TOP:           0       Ectopic: 0         Living: 1 ---------------------------------------------------------------------- Gestational Age  LMP:            36w 2d       Date:  10/15/17                   EDD:  07/22/18  U/S Today:      29w 0d                                         EDD:  09/11/18  Best:           30w 3d    Det. By:  U/S  (04/30/18)            EDD:  09/01/18 ---------------------------------------------------------------------- Anatomy  Ventricles:             Appears normal         Kidneys:                Appear normal  Heart:                  Appears normal         Bladder:                Appears normal                          (  4CH, axis, and situs)  Stomach:                Appears normal, left                          sided ---------------------------------------------------------------------- Cervix Uterus Adnexa  Cervix  Not adaquately visualized ---------------------------------------------------------------------- Impression  Normal interval growth.  HC is not less than 3 standard deviations.  Chronic Hypertension ---------------------------------------------------------------------- Recommendations  BPP with NST in 2 weeks  Repeat growth in 4 weeks.  ----------------------------------------------------------------------               Sander Nephew, MD Electronically Signed Final Report   06/26/2018 11:40 am ----------------------------------------------------------------------    MAU Course  Procedures  MDM -pt here fore evaluation of pre-eclampsia -IV started, labs ordered, hydralazine given by RN for 2 severe range Bps after arrival to MAU -continuous EFM ordered -BP decreased after single dose of hydralazine to 150s/90s, but then rose again to 160s/90s, labetalol protocol started -single dose of labetalol given, BP labile after administration ranging from 90s-130s/60s-80s; pt reports seeing floaters when standing up to use restroom, but denies dizziness/lightheadness/HA or other sx -on labs, PRO-creatinine 0.25, up from 0.12 in 03/2018; serum creatinine 0.64, up from 0.53 in 03/2018 -EFM since admission: baseline 140-150, min/mod variability, neg accels, neg decels. TOCO quivering. Nugent, Gerrie Nordmann, NP   -Spoke with Dr. Ilda Basset - recommended 272m labetalol PO NOW and BPP; requests call with information once complete for further recommendations on management Nugent, NGerrie Nordmann NP   -BPP 8/8 on preliminary [place] -EFM post-US baseline 145, min/moderate variability, - accels, - decels. TOCO quivering. Of note, immediately prior to being removed from the NST for [place] pt had prolonged deceleration starting at 140, with a nadir at 95, extending over approximately 4.596mutes before beginning a return to baseline. Pt was removed from monitor prior to complete return to baseline of the deceleration and was placed on monitor again [place] results. Called and spoke with Dr. ErRip HarbourDr. PiIlda Basseteft for the day) @ 6pm, recommended 40076mabetalol BID at 8am/8pm. Should take first dose tonight at 8pm and follow-up in office in 1wk.  -on discharge conversation with patient, reports feeling well and  denies s/sx of pre-eclampsia.   Orders Placed This Encounter  Procedures  . [place] FETAL BPP WO NON STRESS    Standing Status:   Standing    Number of Occurrences:   1    Order Specific Question:   Symptom/Reason for Exam    Answer:   Non-reassuring electronic fetal monitoring tracing [[id number]] Order Specific Question:   Symptom/Reason for Exam    Answer:   Hypertension affecting pregnancy [[id number]] Urinalysis, Routine w reflex microscopic    Standing Status:   Standing    Number of Occurrences:   1  . Comprehensive metabolic panel    Standing Status:   Standing    Number of Occurrences:   1  . Protein / creatinine ratio, urine    Standing Status:   Standing    Number of Occurrences:   1  . CBC with Differential    Standing Status:   Standing    Number of Occurrences:   1  . Glucose, capillary    Standing Status:   Standing    Number of Occurrences:   1  . Notify Physician    Confirmatory reading of  BP> 160/110 15 minutes later    Standing Status:   Standing    Number of Occurrences:   1    Order Specific Question:   Notify Physician    Answer:   Temp greater than or equal to 100.4    Order Specific Question:   Notify Physician    Answer:   RR greater than 24 or less than 10    Order Specific Question:   Notify Physician    Answer:   HR greater than 120 or less than 50    Order Specific Question:   Notify Physician    Answer:   SBP greater than 160 mmHG or less than 80 mmHG    Order Specific Question:   Notify Physician    Answer:   DBP greater than 110 mmHG or less than 45 mmHG    Order Specific Question:   Notify Physician    Answer:   Urinary output is less than 163m for any 4 hour period  . Vital signs    Standing Status:   Standing    Number of Occurrences:   1  . Fetal monitoring    Standing Status:   Standing    Number of Occurrences:   1  . Measure blood pressure    20 minutes after giving hydralazine 10 MG IV dose.  Call MD if SBP >/= 160 or DBP >/=  110.    Standing Status:   Standing    Number of Occurrences:   1  . Insert peripheral IV    Standing Status:   Standing    Number of Occurrences:   1  . Discharge patient    Order Specific Question:   Discharge disposition    Answer:   01-Home or Self Care [1]    Order Specific Question:   Discharge patient date    Answer:   06/29/2018    Meds ordered this encounter  Medications  . DISCONTD: labetalol (NORMODYNE,TRANDATE) injection 20 mg  . DISCONTD: labetalol (NORMODYNE,TRANDATE) injection 40 mg  . DISCONTD: labetalol (NORMODYNE,TRANDATE) injection 80 mg  . DISCONTD: hydrALAZINE (APRESOLINE) injection 10 mg  . lactated ringers infusion  . AND Linked Order Group   . labetalol (NORMODYNE,TRANDATE) injection 20 mg   . labetalol (NORMODYNE,TRANDATE) injection 40 mg   . labetalol (NORMODYNE,TRANDATE) injection 80 mg   . hydrALAZINE (APRESOLINE) injection 10 mg  . labetalol (NORMODYNE) tablet 200 mg  . labetalol (NORMODYNE) 200 MG tablet    Sig: Take 2 tablets (400 mg total) by mouth 2 (two) times daily for 30 days.    Dispense:  120 tablet    Refill:  0    Order Specific Question:   Supervising Provider    Answer:   PAletha Halim[[[id number]    Assessment and Plan   1. [redacted] weeks gestation of pregnancy   2. Non-reassuring electronic fetal monitoring tracing   3. Hypertension affecting pregnancy   4. Chronic hypertension affecting pregnancy    -discharge to home -f/u in office in 1wk -preeclampsia/return precautions given -RX sent for labetalol 403mBID, discussed RX change with patient, per Dr. ErRip HarbourAllergies as of 06/29/2018   No Known Allergies     Medication List    STOP taking these medications   famotidine 20 MG tablet Commonly known as:  PEPCID     TAKE these medications   ACCU-CHEK FASTCLIX LANCETS Misc 1 Units by Percutaneous route 4 (four) times daily.   ACCU-CHEK NANO SMARTVIEW w/Device  Kit 1 kit by Subdermal route as directed. Check blood sugars  for fasting, and two hours after breakfast, lunch and dinner (4 checks daily)   aspirin EC 81 MG tablet Take 1 tablet (81 mg total) by mouth daily.   glucose blood test strip Commonly known as:  ACCU-CHEK SMARTVIEW Use as instructed to check blood sugars   labetalol 200 MG tablet Commonly known as:  NORMODYNE Take 2 tablets (400 mg total) by mouth 2 (two) times daily for 30 days. What changed:    medication strength  how much to take   prenatal multivitamin Tabs tablet Take 1 tablet by mouth daily at 12 noon.   ranitidine 150 MG capsule Commonly known as:  ZANTAC Take 1 capsule (150 mg total) by mouth 2 (two) times daily.   Wrist Splint/Cock-Up/Left M Misc 1 Units by Does not apply route at bedtime. What changed:  Another medication with the same name was removed. Continue taking this medication, and follow the directions you see here.        Gerrie Nordmann Nugent 06/29/2018, 6:21 PM

## 2018-06-29 NOTE — Progress Notes (Signed)
Subjective:  Lindsay Keith is a 43 y.o. G3P1011 at [redacted]w[redacted]d being seen today for ongoing prenatal care.  She is currently monitored for the following issues for this high-risk pregnancy and has History of herpes genitalis; Obesity in pregnancy; DEPRESSION, MAJOR; TOBACCO DEPENDENCE; ESSENTIAL HYPERTENSION, BENIGN; HEMORRHOIDS, NOS; RHINITIS, ALLERGIC; ASTHMA, INTERMITTENT; GASTROESOPHAGEAL REFLUX, NO ESOPHAGITIS; Supervision of high risk pregnancy, antepartum; Rh negative state in antepartum period; Chronic hypertension affecting pregnancy; BMI 40.0-44.9, adult (HCC); AMA (advanced maternal age) multigravida 35+; and Gestational diabetes mellitus on their problem list.  Patient reports no complaints.  Contractions: Not present. Vag. Bleeding: None.  Movement: Present. Denies leaking of fluid.   The following portions of the patient's history were reviewed and updated as appropriate: allergies, current medications, past family history, past medical history, past social history, past surgical history and problem list. Problem list updated.  Objective:   Vitals:   06/29/18 1026  BP: (!) 165/115  Pulse: 94  Weight: 248 lb 3.2 oz (112.6 kg)    Fetal Status: Fetal Heart Rate (bpm): 150   Movement: Present     General:  Alert, oriented and cooperative. Patient is in no acute distress.  Skin: Skin is warm and dry. No rash noted.   Cardiovascular: Normal heart rate noted  Respiratory: Normal respiratory effort, no problems with respiration noted  Abdomen: Soft, gravid, appropriate for gestational age. Pain/Pressure: Absent     Pelvic: Vag. Bleeding: None     Cervical exam deferred        Extremities: Normal range of motion.  Edema: Mild pitting, slight indentation  Mental Status: Normal mood and affect. Normal behavior. Normal judgment and thought content.   Urinalysis:      Assessment and Plan:  Pregnancy: G3P1011 at [redacted]w[redacted]d  1. Supervision of high risk pregnancy, antepartum FHT  normal  2. Gestational diabetes mellitus (GDM) in third trimester, gestational diabetes method of control unspecified Hasn't picked up testing supplies. Appt with Bev tomorrow  3. Chronic hypertension affecting pregnancy Escalating BPs, acutely worse today. Will send to MAU for labs and serial BPs  4. Multigravida of advanced maternal age in second trimester  5. Rh negative state in antepartum period Rhogam given   6. History of herpes genitalis PPx at 35 weeks  7. BMI 40.0-44.9, adult (HCC)    Preterm labor symptoms and general obstetric precautions including but not limited to vaginal bleeding, contractions, leaking of fluid and fetal movement were reviewed in detail with the patient. Please refer to After Visit Summary for other counseling recommendations.  No follow-ups on file.   Levie Heritage, DO

## 2018-06-29 NOTE — Discharge Instructions (Signed)
Hypertension During Pregnancy  Hypertension, commonly called high blood pressure, is when the force of blood pumping through your arteries is too strong. Arteries are blood vessels that carry blood from the heart throughout the body. Hypertension during pregnancy can cause problems for you and your baby. Your baby may be born early (prematurely) or may not weigh as much as he or she should at birth. Very bad cases of hypertension during pregnancy can be life-threatening. Different types of hypertension can occur during pregnancy. These include:  Chronic hypertension. This happens when: ? You have hypertension before pregnancy and it continues during pregnancy. ? You develop hypertension before you are [redacted] weeks pregnant, and it continues during pregnancy.  Gestational hypertension. This is hypertension that develops after the 20th week of pregnancy.  Preeclampsia, also called toxemia of pregnancy. This is a very serious type of hypertension that develops during pregnancy. It can be very dangerous for you and your baby. ? In rare cases, you may develop preeclampsia after giving birth (postpartum preeclampsia). This usually occurs within 48 hours after childbirth but may occur up to 6 weeks after giving birth. Gestational hypertension and preeclampsia usually go away within 6 weeks after your baby is born. Women who have hypertension during pregnancy have a greater chance of developing hypertension later in life or during future pregnancies. What are the causes? The exact cause of hypertension during pregnancy is not known. What increases the risk? There are certain factors that make it more likely for you to develop hypertension during pregnancy. These include:  Having hypertension during a previous pregnancy or prior to pregnancy.  Being overweight.  Being age 35 or older.  Being pregnant for the first time.  Being pregnant with more than one baby.  Becoming pregnant using fertilization  methods such as IVF (in vitro fertilization).  Having diabetes, kidney problems, or systemic lupus erythematosus.  Having a family history of hypertension. What are the signs or symptoms? Chronic hypertension and gestational hypertension rarely cause symptoms. Preeclampsia causes symptoms, which may include:  Increased protein in your urine. Your health care provider will check for this at every visit before you give birth (prenatal visit).  Severe headaches.  Sudden weight gain.  Swelling of the hands, face, legs, and feet.  Nausea and vomiting.  Vision problems, such as blurred or double vision.  Numbness in the face, arms, legs, and feet.  Dizziness.  Slurred speech.  Sensitivity to bright lights.  Abdominal pain.  Convulsions or seizures. How is this diagnosed? You may be diagnosed with hypertension during a routine prenatal exam. At each prenatal visit, you may:  Have a urine test to check for high amounts of protein in your urine.  Have your blood pressure checked. A blood pressure reading is given as two numbers, such as "120 over 80" (or 120/80). The first ("top") number is a measure of the pressure in your arteries when your heart beats (systolic pressure). The second ("bottom") number is a measure of the pressure in your arteries as your heart relaxes between beats (diastolic pressure). Blood pressure is measured in a unit called mm Hg. For most women, a normal blood pressure reading is: ? Systolic: below 120. ? Diastolic: below 80. The type of hypertension that you are diagnosed with depends on your test results and when your symptoms developed.  Chronic hypertension is usually diagnosed before 20 weeks of pregnancy.  Gestational hypertension is usually diagnosed after 20 weeks of pregnancy.  Hypertension with high amounts of protein in   the urine is diagnosed as preeclampsia.  Blood pressure measurements that stay above 160 systolic, or above 110 diastolic,  are signs of severe preeclampsia. How is this treated? Treatment for hypertension during pregnancy varies depending on the type of hypertension you have and how serious it is.  If you take medicines called ACE inhibitors to treat chronic hypertension, you may need to switch medicines. ACE inhibitors should not be taken during pregnancy.  If you have gestational hypertension, you may need to take blood pressure medicine.  If you are at risk for preeclampsia, your health care provider may recommend that you take a low-dose aspirin during your pregnancy.  If you have severe preeclampsia, you may need to be hospitalized so you and your baby can be monitored closely. You may also need to take medicine (magnesium sulfate) to prevent seizures and to lower blood pressure. This medicine may be given as an injection or through an IV.  In some cases, if your condition gets worse, you may need to deliver your baby early. Follow these instructions at home: Eating and drinking   Drink enough fluid to keep your urine pale yellow.  Avoid caffeine. Lifestyle  Do not use any products that contain nicotine or tobacco, such as cigarettes and e-cigarettes. If you need help quitting, ask your health care provider.  Do not use alcohol or drugs.  Avoid stress as much as possible. Rest and get plenty of sleep. General instructions  Take over-the-counter and prescription medicines only as told by your health care provider.  While lying down, lie on your left side. This keeps pressure off your major blood vessels.  While sitting or lying down, raise (elevate) your feet. Try putting some pillows under your lower legs.  Exercise regularly. Ask your health care provider what kinds of exercise are best for you.  Keep all prenatal and follow-up visits as told by your health care provider. This is important. Contact a health care provider if:  You have symptoms that your health care provider told you may  require more treatment or monitoring, such as: ? Nausea or vomiting. ? Headache. Get help right away if you have:  Severe abdominal pain that does not get better with treatment.  A severe headache that does not get better.  Vomiting that does not get better.  Sudden, rapid weight gain.  Sudden swelling in your hands, ankles, or face.  Vaginal bleeding.  Blood in your urine.  Fewer movements from your baby than usual.  Blurred or double vision.  Muscle twitching or sudden muscle tightening (spasms).  Shortness of breath.  Blue fingernails or lips. Summary  Hypertension, commonly called high blood pressure, is when the force of blood pumping through your arteries is too strong.  Hypertension during pregnancy can cause problems for you and your baby.  Treatment for hypertension during pregnancy varies depending on the type of hypertension you have and how serious it is.  Get help right away if you have symptoms that your health care provider told you to watch for. This information is not intended to replace advice given to you by your health care provider. Make sure you discuss any questions you have with your health care provider. Document Released: 01/01/2011 Document Revised: 04/01/2017 Document Reviewed: 09/29/2015 Elsevier Interactive Patient Education  2019 [employer] Preeclampsia and Eclampsia  Preeclampsia is a serious condition that may develop during pregnancy. It is also called toxemia of pregnancy. This condition causes high blood pressure along with other symptoms, such as   swelling and headaches. These symptoms may develop as the condition gets worse. Preeclampsia may occur at 20 weeks of pregnancy or later. Diagnosing and treating preeclampsia early is very important. If not treated early, it can cause serious problems for you and your baby. One problem it can lead to is eclampsia. Eclampsia is a condition that causes muscle jerking or shaking (convulsions or  seizures) and other serious problems for the mother. During pregnancy, delivering your baby may be the best treatment for preeclampsia or eclampsia. For most women, preeclampsia and eclampsia symptoms go away after giving birth. In rare cases, a woman may develop preeclampsia after giving birth (postpartum preeclampsia). This usually occurs within 48 hours after childbirth but may occur up to 6 weeks after giving birth. What are the causes? The cause of preeclampsia is not known. What increases the risk? The following risk factors make you more likely to develop preeclampsia:  Being pregnant for the first time.  Having had preeclampsia during a past pregnancy.  Having a family history of preeclampsia.  Having high blood pressure.  Being pregnant with more than one baby.  Being 35 or older.  Being African-American.  Having kidney disease or diabetes.  Having medical conditions such as lupus or blood diseases.  Being very overweight (obese). What are the signs or symptoms? The earliest signs of preeclampsia are:  High blood pressure.  Increased protein in your urine. Your health care provider will check for this at every visit before you give birth (prenatal visit). Other symptoms that may develop as the condition gets worse include:  Severe headaches.  Sudden weight gain.  Swelling of the hands, face, legs, and feet.  Nausea and vomiting.  Vision problems, such as blurred or double vision.  Numbness in the face, arms, legs, and feet.  Urinating less than usual.  Dizziness.  Slurred speech.  Abdominal pain, especially upper abdominal pain.  Convulsions or seizures. How is this diagnosed? There are no screening tests for preeclampsia. Your health care provider will ask you about symptoms and check for signs of preeclampsia during your prenatal visits. You may also have tests that include:  Urine tests.  Blood tests.  Checking your blood  pressure.  Monitoring your baby's heart rate.  Ultrasound. How is this treated? You and your health care provider will determine the treatment approach that is best for you. Treatment may include:  Having more frequent prenatal exams to check for signs of preeclampsia, if you have an increased risk for preeclampsia.  Medicine to lower your blood pressure.  Staying in the hospital, if your condition is severe. There, treatment will focus on controlling your blood pressure and the amount of fluids in your body (fluid retention).  Taking medicine (magnesium sulfate) to prevent seizures. This may be given as an injection or through an IV.  Taking a low-dose aspirin during your pregnancy.  Delivering your baby early, if your condition gets worse. You may have your labor started with medicine (induced), or you may have a cesarean delivery. Follow these instructions at home: Eating and drinking   Drink enough fluid to keep your urine pale yellow.  Avoid caffeine. Lifestyle  Do not use any products that contain nicotine or tobacco, such as cigarettes and e-cigarettes. If you need help quitting, ask your health care provider.  Do not use alcohol or drugs.  Avoid stress as much as possible. Rest and get plenty of sleep. General instructions  Take over-the-counter and prescription medicines only as told by your   health care provider.  When lying down, lie on your left side. This keeps pressure off your major blood vessels.  When sitting or lying down, raise (elevate) your feet. Try putting some pillows underneath your lower legs.  Exercise regularly. Ask your health care provider what kinds of exercise are best for you.  Keep all follow-up and prenatal visits as told by your health care provider. This is important. How is this prevented? There is no known way of preventing preeclampsia or eclampsia from developing. However, to lower your risk of complications and detect problems  early:  Get regular prenatal care. Your health care provider may be able to diagnose and treat the condition early.  Maintain a healthy weight. Ask your health care provider for help managing weight gain during pregnancy.  Work with your health care provider to manage any long-term (chronic) health conditions you have, such as diabetes or kidney problems.  You may have tests of your blood pressure and kidney function after giving birth.  Your health care provider may have you take low-dose aspirin during your next pregnancy. Contact a health care provider if:  You have symptoms that your health care provider told you may require more treatment or monitoring, such as: ? Headaches. ? Nausea or vomiting. ? Abdominal pain. ? Dizziness. ? Light-headedness. Get help right away if:  You have severe: ? Abdominal pain. ? Headaches that do not get better. ? Dizziness. ? Vision problems. ? Confusion. ? Nausea or vomiting.  You have any of the following: ? A seizure. ? Sudden, rapid weight gain. ? Sudden swelling in your hands, ankles, or face. ? Trouble moving any part of your body. ? Numbness in any part of your body. ? Trouble speaking. ? Abnormal bleeding.  You faint. Summary  Preeclampsia is a serious condition that may develop during pregnancy. It is also called toxemia of pregnancy.  This condition causes high blood pressure along with other symptoms, such as swelling and headaches.  Diagnosing and treating preeclampsia early is very important. If not treated early, it can cause serious problems for you and your baby.  Get help right away if you have symptoms that your health care provider told you to watch for. This information is not intended to replace advice given to you by your health care provider. Make sure you discuss any questions you have with your health care provider. Document Released: 04/12/2000 Document Revised: 04/01/2017 Document Reviewed:  11/20/2015 Elsevier Interactive Patient Education  2019 Elsevier Inc.  

## 2018-06-29 NOTE — MAU Note (Signed)
Went to MD office and sent here for elevated b/p and swelling.

## 2018-06-30 ENCOUNTER — Other Ambulatory Visit: Payer: Medicaid Other

## 2018-07-02 ENCOUNTER — Ambulatory Visit: Payer: Medicaid Other | Admitting: *Deleted

## 2018-07-02 ENCOUNTER — Other Ambulatory Visit: Payer: Self-pay | Admitting: Emergency Medicine

## 2018-07-02 ENCOUNTER — Encounter: Payer: Medicaid Other | Attending: Obstetrics and Gynecology | Admitting: *Deleted

## 2018-07-02 DIAGNOSIS — O09523 Supervision of elderly multigravida, third trimester: Secondary | ICD-10-CM | POA: Insufficient documentation

## 2018-07-02 DIAGNOSIS — Z6791 Unspecified blood type, Rh negative: Secondary | ICD-10-CM | POA: Insufficient documentation

## 2018-07-02 DIAGNOSIS — Z8619 Personal history of other infectious and parasitic diseases: Secondary | ICD-10-CM | POA: Diagnosis not present

## 2018-07-02 DIAGNOSIS — O26893 Other specified pregnancy related conditions, third trimester: Secondary | ICD-10-CM | POA: Insufficient documentation

## 2018-07-02 DIAGNOSIS — O99213 Obesity complicating pregnancy, third trimester: Secondary | ICD-10-CM | POA: Diagnosis not present

## 2018-07-02 DIAGNOSIS — O24419 Gestational diabetes mellitus in pregnancy, unspecified control: Secondary | ICD-10-CM

## 2018-07-02 DIAGNOSIS — O10913 Unspecified pre-existing hypertension complicating pregnancy, third trimester: Secondary | ICD-10-CM | POA: Insufficient documentation

## 2018-07-02 DIAGNOSIS — Z3A00 Weeks of gestation of pregnancy not specified: Secondary | ICD-10-CM | POA: Diagnosis not present

## 2018-07-02 DIAGNOSIS — O368390 Maternal care for abnormalities of the fetal heart rate or rhythm, unspecified trimester, not applicable or unspecified: Secondary | ICD-10-CM | POA: Insufficient documentation

## 2018-07-02 MED ORDER — ACCU-CHEK GUIDE W/DEVICE KIT: 1.0000 | PACK | Freq: Four times a day (QID) | 0 refills | Status: DC

## 2018-07-02 MED ORDER — ACCU-CHEK FASTCLIX LANCETS MISC: 1.0000 | Freq: Four times a day (QID) | 12 refills | Status: DC

## 2018-07-02 MED ORDER — GLUCOSE BLOOD VI STRP: ORAL_STRIP | 12 refills | Status: DC

## 2018-07-02 NOTE — Progress Notes (Signed)
  Patient was seen on 07/02/2018 for Gestational Diabetes self-management. EDD 09/01/2018. Patient states no history of GDM but has a family history of Diabetes. 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 So she sleeps until noon and then gets ready for work.  Diet history obtained. Patient eats fair variety of all food groups with multiple convenience type foods at lunch time. Beverages include milk, water sweet tea and multiple regular sodas.  The following learning objectives were met by the patient :   States the definition of Gestational Diabetes  States why dietary management is important in controlling blood glucose  Describes the effects of carbohydrates on blood glucose levels  Demonstrates ability to create a balanced meal plan  Demonstrates carbohydrate counting   States when to check blood glucose levels  Demonstrates proper blood glucose monitoring techniques  States the effect of stress and exercise on blood glucose levels  States the importance of limiting caffeine and abstaining from alcohol and smoking  Plan:  Aim for 3 Carb Choices per meal (45 grams) +/- 1 either way  Aim for 1-2 Carbs per snack Begin reading food labels for Total Carbohydrate of foods If OK with your MD, consider  increasing your activity level by walking, Arm Chair Exercises or other activity daily as tolerated Begin checking BG before breakfast and 2 hours after first bite of breakfast, lunch and dinner as directed by MD  Bring Log Book/Sheet and meter to every medical appointment OR use Baby Scripts (see below) Baby Scripts:  Patient was introduced to [employer] but she states she prefers to record BG on Log Sheet Take medication if directed by MD  Blood glucose monitor Rx called into pharmacy: Accu Check Guide with Fast Clix drums (Another meter had been ordered but it is not covered by Medicaid, so order was updated) Patient instructed to test pre breakfast and 2  hours each meal as directed by MD  Patient instructed to monitor glucose levels: FBS: 60 - 95 mg/dl 2 hour: <120 mg/dl  Patient received the following handouts:  Nutrition Diabetes and Pregnancy  Carbohydrate Counting List  BG Log Sheet  Patient will be seen for follow-up as needed.

## 2018-07-06 ENCOUNTER — Telehealth: Payer: Self-pay | Admitting: *Deleted

## 2018-07-06 DIAGNOSIS — O24419 Gestational diabetes mellitus in pregnancy, unspecified control: Secondary | ICD-10-CM

## 2018-07-06 MED ORDER — ACCU-CHEK FASTCLIX LANCETS MISC: 1.0000 | Freq: Four times a day (QID) | 12 refills | Status: DC

## 2018-07-06 MED ORDER — ACCU-CHEK GUIDE W/DEVICE KIT: 1.0000 | PACK | Freq: Once | 0 refills | Status: AC

## 2018-07-06 MED ORDER — GLUCOSE BLOOD VI STRP: ORAL_STRIP | 12 refills | Status: DC

## 2018-07-06 NOTE — Telephone Encounter (Signed)
Received a voicemail from 07/03/18 pm that patient talked with Bev and had rx sent for glucose machine. States got a message pharmacy has lancets ready ;but not machine.    I called Pharmacy and they state wrong orders sent in for meter covered by medicaid and need new orders sent in. I sent in new orders. I called Lindsay Keith and notified her that I got her call and that I  Have spoken with pharmacy and new orders sent in ; also that her supplies should be ready in 3 hours. She voices understanding.

## 2018-07-09 ENCOUNTER — Other Ambulatory Visit (HOSPITAL_COMMUNITY): Payer: Self-pay | Admitting: Maternal & Fetal Medicine

## 2018-07-09 ENCOUNTER — Ambulatory Visit (HOSPITAL_COMMUNITY)
Admission: RE | Admit: 2018-07-09 | Discharge: 2018-07-09 | Disposition: A | Discharge status: Home / Self Care | Payer: Medicaid Other | Source: Ambulatory Visit | Attending: Obstetrics and Gynecology | Admitting: Obstetrics and Gynecology

## 2018-07-09 ENCOUNTER — Other Ambulatory Visit: Payer: Self-pay

## 2018-07-09 ENCOUNTER — Ambulatory Visit (HOSPITAL_COMMUNITY): Payer: Medicaid Other | Admitting: *Deleted

## 2018-07-09 ENCOUNTER — Encounter (HOSPITAL_COMMUNITY): Payer: Self-pay

## 2018-07-09 VITALS — BP 154/103 | HR 83 | Wt 242.6 lb

## 2018-07-09 VITALS — BP 162/104

## 2018-07-09 DIAGNOSIS — O10919 Unspecified pre-existing hypertension complicating pregnancy, unspecified trimester: Secondary | ICD-10-CM | POA: Diagnosis not present

## 2018-07-09 DIAGNOSIS — O289 Unspecified abnormal findings on antenatal screening of mother: Secondary | ICD-10-CM

## 2018-07-09 DIAGNOSIS — O09523 Supervision of elderly multigravida, third trimester: Secondary | ICD-10-CM | POA: Insufficient documentation

## 2018-07-09 DIAGNOSIS — Z3A32 32 weeks gestation of pregnancy: Secondary | ICD-10-CM | POA: Diagnosis not present

## 2018-07-09 DIAGNOSIS — O10013 Pre-existing essential hypertension complicating pregnancy, third trimester: Secondary | ICD-10-CM | POA: Diagnosis not present

## 2018-07-09 DIAGNOSIS — O09529 Supervision of elderly multigravida, unspecified trimester: Secondary | ICD-10-CM | POA: Diagnosis not present

## 2018-07-09 DIAGNOSIS — O99213 Obesity complicating pregnancy, third trimester: Secondary | ICD-10-CM | POA: Diagnosis not present

## 2018-07-09 NOTE — Progress Notes (Signed)
Pt unable to check blood sugars, reports the pharmacy did not have the meter.  Has not eaten today, graham crackers, peanut butter and water given.  Pt with cough, denies fever.  Mask and instructions given for patient to wear during MFM visit.

## 2018-07-09 NOTE — Procedures (Signed)
Lindsay Keith [birth year] [redacted]w[redacted]d  Fetus A Non-Stress Test Interpretation for 07/09/18  Indication: Chronic Hypertenstion  Fetal Heart Rate A Mode: External Baseline Rate (A): 140 bpm Variability: Moderate Accelerations: 10 x 10 Decelerations: None  Uterine Activity Mode: Toco Contraction Frequency (min): none noted  Interpretation (Fetal Testing) Nonstress Test Interpretation: Non-reactive Comments: FHR tracing rev'd by Dr. Judeth Cornfield

## 2018-07-10 ENCOUNTER — Other Ambulatory Visit (HOSPITAL_COMMUNITY): Payer: Self-pay | Admitting: *Deleted

## 2018-07-10 DIAGNOSIS — O10913 Unspecified pre-existing hypertension complicating pregnancy, third trimester: Secondary | ICD-10-CM

## 2018-07-13 ENCOUNTER — Other Ambulatory Visit: Payer: Self-pay

## 2018-07-13 ENCOUNTER — Ambulatory Visit (INDEPENDENT_AMBULATORY_CARE_PROVIDER_SITE_OTHER): Payer: Medicaid Other | Admitting: Obstetrics and Gynecology

## 2018-07-13 VITALS — BP 156/92 | HR 93 | Wt 241.1 lb

## 2018-07-13 DIAGNOSIS — O0990 Supervision of high risk pregnancy, unspecified, unspecified trimester: Secondary | ICD-10-CM

## 2018-07-13 DIAGNOSIS — Z6791 Unspecified blood type, Rh negative: Secondary | ICD-10-CM

## 2018-07-13 DIAGNOSIS — O24419 Gestational diabetes mellitus in pregnancy, unspecified control: Secondary | ICD-10-CM

## 2018-07-13 DIAGNOSIS — O26893 Other specified pregnancy related conditions, third trimester: Secondary | ICD-10-CM

## 2018-07-13 DIAGNOSIS — Z3A32 32 weeks gestation of pregnancy: Secondary | ICD-10-CM

## 2018-07-13 DIAGNOSIS — O10919 Unspecified pre-existing hypertension complicating pregnancy, unspecified trimester: Secondary | ICD-10-CM

## 2018-07-13 DIAGNOSIS — O99213 Obesity complicating pregnancy, third trimester: Secondary | ICD-10-CM

## 2018-07-13 DIAGNOSIS — O09523 Supervision of elderly multigravida, third trimester: Secondary | ICD-10-CM

## 2018-07-13 DIAGNOSIS — Z8619 Personal history of other infectious and parasitic diseases: Secondary | ICD-10-CM

## 2018-07-13 DIAGNOSIS — O360923 Maternal care for other rhesus isoimmunization, second trimester, fetus 3: Secondary | ICD-10-CM

## 2018-07-13 DIAGNOSIS — Z6841 Body Mass Index (BMI) 40.0 and over, adult: Secondary | ICD-10-CM

## 2018-07-13 DIAGNOSIS — O10913 Unspecified pre-existing hypertension complicating pregnancy, third trimester: Secondary | ICD-10-CM

## 2018-07-13 DIAGNOSIS — O26899 Other specified pregnancy related conditions, unspecified trimester: Secondary | ICD-10-CM

## 2018-07-13 LAB — HEMOGLOBIN A1C
Est. average glucose Bld gHb Est-mCnc: 140 mg/dL
Hgb A1c MFr Bld: 6.5 % — ABNORMAL HIGH (ref 4.8–5.6)

## 2018-07-13 LAB — GLUCOSE, RANDOM: Glucose: 189 mg/dL — ABNORMAL HIGH (ref 65–99)

## 2018-07-13 MED ORDER — LABETALOL HCL 200 MG PO TABS: 600.0000 mg | ORAL_TABLET | Freq: Two times a day (BID) | ORAL | 0 refills | Status: DC

## 2018-07-13 NOTE — Progress Notes (Signed)
Prenatal Visit Note Date: 07/13/2018 Clinic: [facility]  Subjective:  Lindsay Keith is a 43 y.o. G3P1011 at [redacted]w[redacted]d being seen today for ongoing prenatal care.  She is currently monitored for the following issues for this high-risk pregnancy and has History of herpes genitalis; Obesity in pregnancy; DEPRESSION, MAJOR; TOBACCO DEPENDENCE; ESSENTIAL HYPERTENSION, BENIGN; HEMORRHOIDS, NOS; RHINITIS, ALLERGIC; ASTHMA, INTERMITTENT; GASTROESOPHAGEAL REFLUX, NO ESOPHAGITIS; Supervision of high risk pregnancy, antepartum; Rh negative state in antepartum period; Chronic hypertension affecting pregnancy; BMI 40.0-44.9, adult (HCC); AMA (advanced maternal age) multigravida 35+; and Gestational diabetes mellitus on their problem list.  Patient reports no complaints.   Contractions: Irritability. Vag. Bleeding: None.  Movement: Present. Denies leaking of fluid.   The following portions of the patient's history were reviewed and updated as appropriate: allergies, current medications, past family history, past medical history, past social history, past surgical history and problem list. Problem list updated.  Objective:   Vitals:   07/13/18 0955 07/13/18 0958  BP: (!) 143/95 (!) 156/92  Pulse: 97 93  Weight: 241 lb 1.6 oz (109.4 kg)     Fetal Status: Fetal Heart Rate (bpm): 149   Movement: Present     General:  Alert, oriented and cooperative. Patient is in no acute distress.  Skin: Skin is warm and dry. No rash noted.   Cardiovascular: Normal heart rate noted  Respiratory: Normal respiratory effort, no problems with respiration noted  Abdomen: Soft, gravid, appropriate for gestational age. Pain/Pressure: Absent     Pelvic:  Cervical exam deferred        Extremities: Normal range of motion.  Edema: Trace  Mental Status: Normal mood and affect. Normal behavior. Normal judgment and thought content.   Urinalysis:      Assessment and Plan:  Pregnancy: G3P1011 at  [redacted]w[redacted]d  1. Chronic hypertension affecting pregnancy On labetalol 400 bid and took am dose. Recommend going to 600 bid. 8/10 bpp last week and has rpt for 3/20. Continue with weekly testing  2. Gestational diabetes mellitus (GDM) in third trimester, gestational diabetes method of control unspecified On the phone with the pharmacy now since having issues getting her meter and supplies. Pt has log sheet with values. RTC 1wk to check BS. Pt had sausage biscuit and plain coffee in waiting room. Will check random and get a1c - Hemoglobin A1c - Glucose, random  3. Supervision of high risk pregnancy, antepartum Routine care. No btl. Delivery at approx 39wks - Hemoglobin A1c - Glucose, random  4. Rh negative state in antepartum period S/p rhogam already  5. History of herpes genitalis Offer ppx at 34-36wks  6. Multigravida of advanced maternal age in third trimester  7. BMI 40.0-44.9, adult (HCC)  Preterm labor symptoms and general obstetric precautions including but not limited to vaginal bleeding, contractions, leaking of fluid and fetal movement were reviewed in detail with the patient. Please refer to After Visit Summary for other counseling recommendations.  Return in about 1 week (around 07/20/2018) for hrob.   Esbon Bing, MD

## 2018-07-13 NOTE — Progress Notes (Unsigned)
Pt states never rcvd a call on 07/06/18 from any here at the clinic, claims she was never told that the monitor was ready at [facility].on [address]. So I called them and they stated  Monitor has been ready since 07/06/18. Advised pt & she verbalized understanding.

## 2018-07-13 NOTE — Progress Notes (Signed)
Pressure in vagina when coughs.

## 2018-07-17 ENCOUNTER — Encounter (HOSPITAL_COMMUNITY): Payer: Self-pay

## 2018-07-17 ENCOUNTER — Inpatient Hospital Stay (HOSPITAL_COMMUNITY)
Admission: AD | Admit: 2018-07-17 | Discharge: 2018-07-22 | DRG: 788 | Disposition: A | Discharge status: Home / Self Care | Payer: Medicaid Other | Attending: Obstetrics and Gynecology | Admitting: Obstetrics and Gynecology

## 2018-07-17 ENCOUNTER — Ambulatory Visit (HOSPITAL_COMMUNITY): Payer: Medicaid Other | Admitting: *Deleted

## 2018-07-17 ENCOUNTER — Ambulatory Visit (HOSPITAL_COMMUNITY)
Admission: RE | Admit: 2018-07-17 | Discharge: 2018-07-17 | Disposition: A | Discharge status: Home / Self Care | Payer: Medicaid Other | Source: Ambulatory Visit | Attending: Obstetrics and Gynecology | Admitting: Obstetrics and Gynecology

## 2018-07-17 ENCOUNTER — Encounter (HOSPITAL_COMMUNITY): Payer: Self-pay | Admitting: *Deleted

## 2018-07-17 ENCOUNTER — Encounter: Payer: Medicaid Other | Admitting: Family Medicine

## 2018-07-17 ENCOUNTER — Other Ambulatory Visit: Payer: Self-pay

## 2018-07-17 VITALS — BP 163/108 | HR 89 | Temp 97.8°F | Wt 247.1 lb

## 2018-07-17 DIAGNOSIS — O10919 Unspecified pre-existing hypertension complicating pregnancy, unspecified trimester: Secondary | ICD-10-CM

## 2018-07-17 DIAGNOSIS — O26893 Other specified pregnancy related conditions, third trimester: Secondary | ICD-10-CM | POA: Diagnosis present

## 2018-07-17 DIAGNOSIS — O9962 Diseases of the digestive system complicating childbirth: Secondary | ICD-10-CM | POA: Diagnosis present

## 2018-07-17 DIAGNOSIS — Z23 Encounter for immunization: Secondary | ICD-10-CM

## 2018-07-17 DIAGNOSIS — O289 Unspecified abnormal findings on antenatal screening of mother: Secondary | ICD-10-CM

## 2018-07-17 DIAGNOSIS — R030 Elevated blood-pressure reading, without diagnosis of hypertension: Secondary | ICD-10-CM | POA: Diagnosis present

## 2018-07-17 DIAGNOSIS — O368330 Maternal care for abnormalities of the fetal heart rate or rhythm, third trimester, not applicable or unspecified: Secondary | ICD-10-CM

## 2018-07-17 DIAGNOSIS — O09523 Supervision of elderly multigravida, third trimester: Secondary | ICD-10-CM

## 2018-07-17 DIAGNOSIS — O1002 Pre-existing essential hypertension complicating childbirth: Secondary | ICD-10-CM | POA: Diagnosis present

## 2018-07-17 DIAGNOSIS — Z3A33 33 weeks gestation of pregnancy: Secondary | ICD-10-CM

## 2018-07-17 DIAGNOSIS — O99213 Obesity complicating pregnancy, third trimester: Secondary | ICD-10-CM | POA: Diagnosis not present

## 2018-07-17 DIAGNOSIS — O10913 Unspecified pre-existing hypertension complicating pregnancy, third trimester: Secondary | ICD-10-CM | POA: Diagnosis not present

## 2018-07-17 DIAGNOSIS — Z6791 Unspecified blood type, Rh negative: Secondary | ICD-10-CM | POA: Diagnosis not present

## 2018-07-17 DIAGNOSIS — F17210 Nicotine dependence, cigarettes, uncomplicated: Secondary | ICD-10-CM | POA: Diagnosis present

## 2018-07-17 DIAGNOSIS — O99214 Obesity complicating childbirth: Secondary | ICD-10-CM | POA: Diagnosis present

## 2018-07-17 DIAGNOSIS — O10013 Pre-existing essential hypertension complicating pregnancy, third trimester: Secondary | ICD-10-CM | POA: Diagnosis not present

## 2018-07-17 DIAGNOSIS — O99334 Smoking (tobacco) complicating childbirth: Secondary | ICD-10-CM | POA: Diagnosis present

## 2018-07-17 DIAGNOSIS — K219 Gastro-esophageal reflux disease without esophagitis: Secondary | ICD-10-CM | POA: Diagnosis present

## 2018-07-17 DIAGNOSIS — Z3A00 Weeks of gestation of pregnancy not specified: Secondary | ICD-10-CM | POA: Diagnosis not present

## 2018-07-17 DIAGNOSIS — O24420 Gestational diabetes mellitus in childbirth, diet controlled: Secondary | ICD-10-CM | POA: Diagnosis present

## 2018-07-17 DIAGNOSIS — O368390 Maternal care for abnormalities of the fetal heart rate or rhythm, unspecified trimester, not applicable or unspecified: Secondary | ICD-10-CM

## 2018-07-17 LAB — COMPREHENSIVE METABOLIC PANEL
ALBUMIN: 2.7 g/dL — AB (ref 3.5–5.0)
ALT: 14 U/L (ref 0–44)
AST: 14 U/L — ABNORMAL LOW (ref 15–41)
Alkaline Phosphatase: 78 U/L (ref 38–126)
Anion gap: 6 (ref 5–15)
BUN: 7 mg/dL (ref 6–20)
CHLORIDE: 105 mmol/L (ref 98–111)
CO2: 23 mmol/L (ref 22–32)
Calcium: 8.9 mg/dL (ref 8.9–10.3)
Creatinine, Ser: 0.6 mg/dL (ref 0.44–1.00)
GFR calc Af Amer: >60 mL/min (ref >60)
GFR calc non Af Amer: >60 mL/min (ref >60)
Glucose, Bld: 104 mg/dL — ABNORMAL HIGH (ref 70–99)
Potassium: 4.2 mmol/L (ref 3.5–5.1)
SODIUM: 134 mmol/L — AB (ref 135–145)
Total Bilirubin: 0.3 mg/dL (ref 0.3–1.2)
Total Protein: 5.8 g/dL — ABNORMAL LOW (ref 6.5–8.1)

## 2018-07-17 LAB — CBC
HCT: 39.9 % (ref 36.0–46.0)
Hemoglobin: 12.9 g/dL (ref 12.0–15.0)
MCH: 30.1 pg (ref 26.0–34.0)
MCHC: 32.3 g/dL (ref 30.0–36.0)
MCV: 93.2 fL (ref 80.0–100.0)
PLATELETS: 272 10*3/uL (ref 150–400)
RBC: 4.28 MIL/uL (ref 3.87–5.11)
RDW: 13.3 % (ref 11.5–15.5)
WBC: 15.2 10*3/uL — ABNORMAL HIGH (ref 4.0–10.5)
nRBC: 0 % (ref 0.0–0.2)

## 2018-07-17 LAB — PROTEIN / CREATININE RATIO, URINE
Creatinine, Urine: 187.87 mg/dL
Protein Creatinine Ratio: 0.26 mg/mg{Cre} — ABNORMAL HIGH (ref 0.00–0.15)
Total Protein, Urine: 49 mg/dL

## 2018-07-17 MED ORDER — FAMOTIDINE 20 MG PO TABS
20.0000 mg | ORAL_TABLET | Freq: Two times a day (BID) | ORAL | Status: DC
  Administered 2018-07-18: 20 mg via ORAL
  Filled 2018-07-17 (×2): qty 1

## 2018-07-17 MED ORDER — LABETALOL HCL 5 MG/ML IV SOLN: 20.0000 mg | INTRAVENOUS | Status: DC | PRN

## 2018-07-17 MED ORDER — PRENATAL MULTIVITAMIN CH: 1.0000 | ORAL_TABLET | Freq: Every day | ORAL | Status: DC

## 2018-07-17 MED ORDER — CALCIUM CARBONATE ANTACID 500 MG PO CHEW: 2.0000 | CHEWABLE_TABLET | ORAL | Status: DC | PRN

## 2018-07-17 MED ORDER — ENOXAPARIN SODIUM 40 MG/0.4ML ~~LOC~~ SOLN: 40.0000 mg | SUBCUTANEOUS | Status: DC

## 2018-07-17 MED ORDER — HYDRALAZINE HCL 20 MG/ML IJ SOLN
10.0000 mg | INTRAMUSCULAR | Status: DC | PRN
  Administered 2018-07-17: 10 mg via INTRAVENOUS
  Filled 2018-07-17: qty 1

## 2018-07-17 MED ORDER — HYDRALAZINE HCL 20 MG/ML IJ SOLN
5.0000 mg | INTRAMUSCULAR | Status: DC | PRN
  Administered 2018-07-17: 5 mg via INTRAVENOUS
  Filled 2018-07-17: qty 1

## 2018-07-17 MED ORDER — ACETAMINOPHEN 325 MG PO TABS
650.0000 mg | ORAL_TABLET | ORAL | Status: DC | PRN
  Administered 2018-07-19 – 2018-07-22 (×4): 650 mg via ORAL
  Filled 2018-07-17 (×5): qty 2

## 2018-07-17 MED ORDER — LABETALOL HCL 5 MG/ML IV SOLN: 40.0000 mg | INTRAVENOUS | Status: DC | PRN

## 2018-07-17 MED ORDER — DOCUSATE SODIUM 100 MG PO CAPS
100.0000 mg | ORAL_CAPSULE | Freq: Every day | ORAL | Status: DC
  Administered 2018-07-18: 100 mg via ORAL
  Filled 2018-07-17: qty 1

## 2018-07-17 MED ORDER — BETAMETHASONE SOD PHOS & ACET 6 (3-3) MG/ML IJ SUSP
12.0000 mg | INTRAMUSCULAR | Status: AC
  Administered 2018-07-17: 12 mg via INTRAMUSCULAR
  Filled 2018-07-17 (×2): qty 2

## 2018-07-17 MED ORDER — ASPIRIN EC 81 MG PO TBEC
81.0000 mg | DELAYED_RELEASE_TABLET | Freq: Every day | ORAL | Status: DC
  Administered 2018-07-18: 81 mg via ORAL
  Filled 2018-07-17: qty 1

## 2018-07-17 MED ORDER — LABETALOL HCL 200 MG PO TABS
600.0000 mg | ORAL_TABLET | Freq: Three times a day (TID) | ORAL | Status: DC
  Administered 2018-07-17 – 2018-07-18 (×3): 600 mg via ORAL
  Filled 2018-07-17 (×3): qty 3
  Filled 2018-07-17: qty 6

## 2018-07-17 NOTE — MAU Provider Note (Addendum)
History     CSN: [id number]  Arrival date and time: 07/17/18 7829   First Provider Initiated Contact with Patient 07/17/18 1851      Chief Complaint  Patient presents with  . Hypertension   [name] presents after being sent from MFM for elevated blood pressure She has a history of chronic hypertension that was not being treated for 2 years prior to pregnancy because she has not sought care During this pregnancy, she has been treated with labetalol, which has been titrated up Her current dose is 617m BID, which she reports 100% adherence  She denies headaches, vision changes, RUQ or epigastric pain, N/V Her last pregnancy was 26 years ago, and she did not have any blood pressure problems at that time She was diagnosed with HTN 10 years ago   Past Medical History:  Diagnosis Date  . Asthma   . Bartholin's gland abscess 02/27/2011  . Depression   . GERD (gastroesophageal reflux disease)   . Gestational diabetes mellitus 06/18/2018  . Hypertension     Past Surgical History:  Procedure Laterality Date  . WISDOM TOOTH EXTRACTION      History reviewed. No pertinent family history.  Social History   Tobacco Use  . Smoking status: Current Every Day Smoker    Packs/day: 0.25  . Smokeless tobacco: Never Used  Substance Use Topics  . Alcohol use: Not Currently  . Drug use: Yes    Types: Marijuana    Comment: Last marijuana Feb 2020    Allergies: No Known Allergies  Medications Prior to Admission  Medication Sig Dispense Refill Last Dose  . ACCU-CHEK FASTCLIX LANCETS MISC 1 Units by Percutaneous route 4 (four) times daily. 100 each 12 07/17/2018 at Unknown time  . Accu-Chek FastClix Lancets MISC 1 each by Percutaneous route 4 (four) times daily. 100 each 12 07/17/2018 at Unknown time  . aspirin EC 81 MG tablet Take 1 tablet (81 mg total) by mouth daily. 60 tablet 2 07/17/2018 at Unknown time  . Blood Glucose Monitoring Suppl (ACCU-CHEK NANO SMARTVIEW) w/Device  KIT 1 kit by Subdermal route as directed. Check blood sugars for fasting, and two hours after breakfast, lunch and dinner (4 checks daily) 1 kit 0 07/17/2018 at Unknown time  . Elastic Bandages & Supports (WRIST SPLINT/COCK-UP/LEFT M) MISC 1 Units by Does not apply route at bedtime. 1 each 0 07/17/2018 at Unknown time  . glucose blood test strip Four times per day 50 each 12 07/17/2018 at Unknown time  . labetalol (NORMODYNE) 200 MG tablet Take 3 tablets (600 mg total) by mouth 2 (two) times daily for 30 days. 180 tablet 0 07/17/2018 at Unknown time  . Prenatal Vit-Fe Fumarate-FA (PRENATAL MULTIVITAMIN) TABS tablet Take 1 tablet by mouth daily at 12 noon.   07/17/2018 at Unknown time  . ranitidine (ZANTAC) 150 MG capsule Take 1 capsule (150 mg total) by mouth 2 (two) times daily. 60 capsule 2 07/17/2018 at Unknown time    Review of Systems  All other systems reviewed and are negative.  Physical Exam   Blood pressure (!) 169/112, pulse 79, temperature 97.6 F (36.4 C), resp. rate 18, last menstrual period 10/15/2017.  Physical Exam  Constitutional: She is oriented to person, place, and time. She appears well-developed and well-nourished. No distress.  HENT:  Head: Normocephalic and atraumatic.  Eyes: Pupils are equal, round, and reactive to light. Conjunctivae and EOM are normal. Right eye exhibits no discharge. Left eye exhibits no discharge. No scleral icterus.  Cardiovascular: Normal rate.  Respiratory: Effort normal. No respiratory distress.  GI: Soft.  gravid  Neurological: She is alert and oriented to person, place, and time.  Skin: She is not diaphoretic.  Psychiatric: She has a normal mood and affect. Her behavior is normal. Judgment and thought content normal.  Strip: baseline 140s/min-mod variability/no acels/occasional decel  MAU Course  Procedures  Patient with recurrent severe range blood pressures in the context of chronic hypertension  Prior severe range pressures treated  with MAU monitoring, PRN meds and was discharged home in stable condition However, has nonreactive NST with decelerations Will admit for blood pressure and fetal monitoring, which patient is agreeable  Preeclampsia labs ordered   Assessment and Plan  Chronic hypertension with exacerbation during pregnancy in third trimester - intermittent severe range blood pressures - admit to antepartum for blood pressure and fetal monitoring - increase labetalol from 670m BID to 606mTID - betamethasone x 2 - GBS culture - BPP earlier today 8/10 (nonreactive NST) - consider repeat BPP later in admission  NiAura Camps/20/2020, 6:52 PM

## 2018-07-17 NOTE — Progress Notes (Signed)
Pt sent to MAU for further monitoring.

## 2018-07-17 NOTE — Procedures (Signed)
Lindsay Keith 02/28/1976 100w3d  Fetus A Non-Stress Test Interpretation for 07/17/18  Indication: Chronic Hypertenstion  Fetal Heart Rate A Mode: External Baseline Rate (A): 140 bpm Variability: Moderate Accelerations: 15 x 15, 10 x 10 Decelerations: None Multiple birth?: No  Uterine Activity Mode: Palpation, Toco Contraction Frequency (min): none noted Resting Tone Palpated: Relaxed Resting Time: Adequate  Interpretation (Fetal Testing) Nonstress Test Interpretation: Non-reactive Comments: FHR tracing reviewed by Dr. Judeth Cornfield

## 2018-07-17 NOTE — MAU Note (Signed)
Pt on phone upset at admission and other things that are going on in her life. B/P elevated in severve range 184/121. Had pt lay back and take deep breaths and relax waited about 5 min and rechecked b/p 195/129. notified Dr. Aneta Mins. Order to give scheduled 600mg  of labetalol . Called pharmacy to release medication in poyxis. Still not released at 2142. Will call again.

## 2018-07-17 NOTE — MAU Note (Signed)
Sent from office for elevated b/p. Chronic HTN. On Labetalol 300mg  BID. Denies any Headache or visual problems. Good fetal movement reported.

## 2018-07-18 ENCOUNTER — Inpatient Hospital Stay (HOSPITAL_COMMUNITY): Payer: Medicaid Other | Admitting: Anesthesiology

## 2018-07-18 ENCOUNTER — Encounter (HOSPITAL_COMMUNITY)
Admission: AD | Disposition: A | Discharge status: Home / Self Care | Payer: Self-pay | Source: Home / Self Care | Attending: Obstetrics and Gynecology

## 2018-07-18 ENCOUNTER — Encounter (HOSPITAL_COMMUNITY): Payer: Self-pay | Admitting: Anesthesiology

## 2018-07-18 ENCOUNTER — Inpatient Hospital Stay (HOSPITAL_COMMUNITY): Payer: Medicaid Other

## 2018-07-18 DIAGNOSIS — O289 Unspecified abnormal findings on antenatal screening of mother: Secondary | ICD-10-CM

## 2018-07-18 DIAGNOSIS — Z3A33 33 weeks gestation of pregnancy: Secondary | ICD-10-CM

## 2018-07-18 LAB — CBC
HEMATOCRIT: 41.4 % (ref 36.0–46.0)
Hemoglobin: 13 g/dL (ref 12.0–15.0)
MCH: 29.2 pg (ref 26.0–34.0)
MCHC: 31.4 g/dL (ref 30.0–36.0)
MCV: 93 fL (ref 80.0–100.0)
Platelets: 299 10*3/uL (ref 150–400)
RBC: 4.45 MIL/uL (ref 3.87–5.11)
RDW: 13.1 % (ref 11.5–15.5)
WBC: 20.5 10*3/uL — ABNORMAL HIGH (ref 4.0–10.5)
nRBC: 0 % (ref 0.0–0.2)

## 2018-07-18 LAB — GLUCOSE, CAPILLARY: Glucose-Capillary: 190 mg/dL — ABNORMAL HIGH (ref 70–99)

## 2018-07-18 SURGERY — Surgical Case
Anesthesia: Spinal

## 2018-07-18 MED ORDER — IBUPROFEN 800 MG PO TABS
800.0000 mg | ORAL_TABLET | Freq: Three times a day (TID) | ORAL | Status: AC
  Administered 2018-07-18 – 2018-07-20 (×6): 800 mg via ORAL
  Filled 2018-07-18 (×6): qty 1

## 2018-07-18 MED ORDER — ACETAMINOPHEN 160 MG/5ML PO SOLN: 325.0000 mg | ORAL | Status: DC | PRN

## 2018-07-18 MED ORDER — SIMETHICONE 80 MG PO CHEW: 80.0000 mg | CHEWABLE_TABLET | ORAL | Status: DC | PRN

## 2018-07-18 MED ORDER — SENNOSIDES-DOCUSATE SODIUM 8.6-50 MG PO TABS
2.0000 | ORAL_TABLET | ORAL | Status: DC
  Administered 2018-07-19 – 2018-07-21 (×4): 2 via ORAL
  Filled 2018-07-18 (×4): qty 2

## 2018-07-18 MED ORDER — HYDRALAZINE HCL 20 MG/ML IJ SOLN
10.0000 mg | INTRAMUSCULAR | Status: DC | PRN
  Administered 2018-07-20 – 2018-07-21 (×3): 10 mg via INTRAVENOUS
  Filled 2018-07-18 (×3): qty 1

## 2018-07-18 MED ORDER — LACTATED RINGERS IV BOLUS
500.0000 mL | Freq: Once | INTRAVENOUS | Status: AC
  Administered 2018-07-18: 01:00:00 via INTRAVENOUS

## 2018-07-18 MED ORDER — AMLODIPINE BESYLATE 10 MG PO TABS
10.0000 mg | ORAL_TABLET | Freq: Every day | ORAL | Status: DC
  Administered 2018-07-18 – 2018-07-20 (×3): 10 mg via ORAL
  Filled 2018-07-18 (×3): qty 1

## 2018-07-18 MED ORDER — ENOXAPARIN SODIUM 60 MG/0.6ML ~~LOC~~ SOLN
55.0000 mg | SUBCUTANEOUS | Status: DC
  Administered 2018-07-18 – 2018-07-21 (×4): 55 mg via SUBCUTANEOUS
  Filled 2018-07-18 (×4): qty 0.6

## 2018-07-18 MED ORDER — PHENYLEPHRINE HCL-NACL 20-0.9 MG/250ML-% IV SOLN
INTRAVENOUS | Status: DC | PRN
  Administered 2018-07-18: 20 ug/min via INTRAVENOUS

## 2018-07-18 MED ORDER — FENTANYL CITRATE (PF) 100 MCG/2ML IJ SOLN
INTRAMUSCULAR | Status: DC | PRN
  Administered 2018-07-18: 15 ug via INTRATHECAL

## 2018-07-18 MED ORDER — MORPHINE SULFATE (PF) 0.5 MG/ML IJ SOLN
INTRAMUSCULAR | Status: AC
  Filled 2018-07-18: qty 10

## 2018-07-18 MED ORDER — SODIUM CHLORIDE 0.9 % IR SOLN
Status: DC | PRN
  Administered 2018-07-18: 600 mL

## 2018-07-18 MED ORDER — TRAMADOL HCL 50 MG PO TABS: 50.0000 mg | ORAL_TABLET | Freq: Four times a day (QID) | ORAL | Status: DC | PRN

## 2018-07-18 MED ORDER — COCONUT OIL OIL: 1.0000 "application " | TOPICAL_OIL | Status: DC | PRN

## 2018-07-18 MED ORDER — OXYTOCIN 40 UNITS IN NORMAL SALINE INFUSION - SIMPLE MED
2.5000 [IU]/h | INTRAVENOUS | Status: AC
  Administered 2018-07-18: 2.5 [IU]/h via INTRAVENOUS

## 2018-07-18 MED ORDER — OXYCODONE HCL 5 MG PO TABS: 5.0000 mg | ORAL_TABLET | Freq: Once | ORAL | Status: DC | PRN

## 2018-07-18 MED ORDER — MEPERIDINE HCL 25 MG/ML IJ SOLN: 6.2500 mg | INTRAMUSCULAR | Status: DC | PRN

## 2018-07-18 MED ORDER — LACTATED RINGERS IV SOLN
INTRAVENOUS | Status: DC | PRN
  Administered 2018-07-18: 01:00:00 via INTRAVENOUS

## 2018-07-18 MED ORDER — DIBUCAINE 1 % RE OINT: 1.0000 "application " | TOPICAL_OINTMENT | RECTAL | Status: DC | PRN

## 2018-07-18 MED ORDER — LABETALOL HCL 5 MG/ML IV SOLN
20.0000 mg | INTRAVENOUS | Status: DC | PRN
  Administered 2018-07-20 – 2018-07-22 (×3): 20 mg via INTRAVENOUS
  Filled 2018-07-18 (×3): qty 4

## 2018-07-18 MED ORDER — HYDRALAZINE HCL 20 MG/ML IJ SOLN
5.0000 mg | INTRAMUSCULAR | Status: DC | PRN
  Administered 2018-07-20 – 2018-07-21 (×3): 5 mg via INTRAVENOUS
  Filled 2018-07-18 (×3): qty 1

## 2018-07-18 MED ORDER — OXYCODONE HCL 5 MG/5ML PO SOLN: 5.0000 mg | Freq: Once | ORAL | Status: DC | PRN

## 2018-07-18 MED ORDER — CEFAZOLIN SODIUM-DEXTROSE 2-4 GM/100ML-% IV SOLN
INTRAVENOUS | Status: AC
  Filled 2018-07-18: qty 100

## 2018-07-18 MED ORDER — OXYTOCIN 10 UNIT/ML IJ SOLN
INTRAVENOUS | Status: DC | PRN
  Administered 2018-07-18: 40 [IU] via INTRAVENOUS

## 2018-07-18 MED ORDER — PNEUMOCOCCAL VAC POLYVALENT 25 MCG/0.5ML IJ INJ
0.5000 mL | INJECTION | INTRAMUSCULAR | Status: AC
  Administered 2018-07-19: 0.5 mL via INTRAMUSCULAR
  Filled 2018-07-18: qty 0.5

## 2018-07-18 MED ORDER — ONDANSETRON HCL 4 MG/2ML IJ SOLN
INTRAMUSCULAR | Status: DC | PRN
  Administered 2018-07-18: 4 mg via INTRAVENOUS

## 2018-07-18 MED ORDER — FENTANYL CITRATE (PF) 100 MCG/2ML IJ SOLN: 25.0000 ug | INTRAMUSCULAR | Status: DC | PRN

## 2018-07-18 MED ORDER — TETANUS-DIPHTH-ACELL PERTUSSIS 5-2.5-18.5 LF-MCG/0.5 IM SUSP: 0.5000 mL | Freq: Once | INTRAMUSCULAR | Status: DC

## 2018-07-18 MED ORDER — BUPIVACAINE IN DEXTROSE 0.75-8.25 % IT SOLN
INTRATHECAL | Status: DC | PRN
  Administered 2018-07-18: 1.5 mL via INTRATHECAL

## 2018-07-18 MED ORDER — CEFAZOLIN SODIUM-DEXTROSE 2-3 GM-%(50ML) IV SOLR
INTRAVENOUS | Status: DC | PRN
  Administered 2018-07-18: 2 g via INTRAVENOUS

## 2018-07-18 MED ORDER — SOD CITRATE-CITRIC ACID 500-334 MG/5ML PO SOLN
30.0000 mL | Freq: Once | ORAL | Status: AC
  Administered 2018-07-18: 30 mL via ORAL
  Filled 2018-07-18: qty 15

## 2018-07-18 MED ORDER — MORPHINE SULFATE (PF) 0.5 MG/ML IJ SOLN
INTRAMUSCULAR | Status: DC | PRN
  Administered 2018-07-18: .15 mg via INTRATHECAL

## 2018-07-18 MED ORDER — FENTANYL CITRATE (PF) 100 MCG/2ML IJ SOLN
INTRAMUSCULAR | Status: AC
  Filled 2018-07-18: qty 2

## 2018-07-18 MED ORDER — SODIUM CHLORIDE 0.9 % IR SOLN
Status: DC | PRN
  Administered 2018-07-18: 1000 mL

## 2018-07-18 MED ORDER — MENTHOL 3 MG MT LOZG: 1.0000 | LOZENGE | OROMUCOSAL | Status: DC | PRN

## 2018-07-18 MED ORDER — SIMETHICONE 80 MG PO CHEW
80.0000 mg | CHEWABLE_TABLET | ORAL | Status: DC
  Administered 2018-07-19 – 2018-07-21 (×4): 80 mg via ORAL
  Filled 2018-07-18 (×4): qty 1

## 2018-07-18 MED ORDER — PRENATAL MULTIVITAMIN CH
1.0000 | ORAL_TABLET | Freq: Every day | ORAL | Status: DC
  Administered 2018-07-18 – 2018-07-21 (×4): 1 via ORAL
  Filled 2018-07-18 (×4): qty 1

## 2018-07-18 MED ORDER — RHO D IMMUNE GLOBULIN 1500 UNIT/2ML IJ SOSY
300.0000 ug | PREFILLED_SYRINGE | Freq: Once | INTRAMUSCULAR | Status: AC
  Administered 2018-07-19: 300 ug via INTRAVENOUS
  Filled 2018-07-18: qty 2

## 2018-07-18 MED ORDER — ACETAMINOPHEN 325 MG PO TABS: 325.0000 mg | ORAL_TABLET | ORAL | Status: DC | PRN

## 2018-07-18 MED ORDER — WITCH HAZEL-GLYCERIN EX PADS: 1.0000 "application " | MEDICATED_PAD | CUTANEOUS | Status: DC | PRN

## 2018-07-18 MED ORDER — ZOLPIDEM TARTRATE 5 MG PO TABS: 5.0000 mg | ORAL_TABLET | Freq: Every evening | ORAL | Status: DC | PRN

## 2018-07-18 MED ORDER — LACTATED RINGERS IV SOLN: INTRAVENOUS | Status: DC

## 2018-07-18 MED ORDER — LABETALOL HCL 5 MG/ML IV SOLN: 40.0000 mg | INTRAVENOUS | Status: DC | PRN

## 2018-07-18 MED ORDER — ONDANSETRON HCL 4 MG/2ML IJ SOLN: 4.0000 mg | Freq: Once | INTRAMUSCULAR | Status: DC | PRN

## 2018-07-18 MED ORDER — DIPHENHYDRAMINE HCL 25 MG PO CAPS
25.0000 mg | ORAL_CAPSULE | Freq: Four times a day (QID) | ORAL | Status: DC | PRN
  Administered 2018-07-18: 25 mg via ORAL
  Filled 2018-07-18: qty 1

## 2018-07-18 MED ORDER — OXYTOCIN 40 UNITS IN NORMAL SALINE INFUSION - SIMPLE MED
INTRAVENOUS | Status: AC
  Filled 2018-07-18: qty 1000

## 2018-07-18 MED ORDER — OXYCODONE HCL 5 MG PO TABS
5.0000 mg | ORAL_TABLET | ORAL | Status: DC | PRN
  Administered 2018-07-18: 5 mg via ORAL
  Administered 2018-07-19 – 2018-07-21 (×4): 10 mg via ORAL
  Administered 2018-07-21: 5 mg via ORAL
  Administered 2018-07-21: 10 mg via ORAL
  Filled 2018-07-18: qty 1
  Filled 2018-07-18 (×3): qty 2
  Filled 2018-07-18: qty 1
  Filled 2018-07-18 (×2): qty 2

## 2018-07-18 MED ORDER — PHENYLEPHRINE HCL-NACL 20-0.9 MG/250ML-% IV SOLN
INTRAVENOUS | Status: AC
  Filled 2018-07-18: qty 250

## 2018-07-18 MED ORDER — SIMETHICONE 80 MG PO CHEW
80.0000 mg | CHEWABLE_TABLET | Freq: Three times a day (TID) | ORAL | Status: DC
  Administered 2018-07-18 – 2018-07-22 (×12): 80 mg via ORAL
  Filled 2018-07-18 (×12): qty 1

## 2018-07-18 SURGICAL SUPPLY — 45 items
APL SKNCLS STERI-STRIP NONHPOA (GAUZE/BANDAGES/DRESSINGS) ×1
BENZOIN TINCTURE PRP APPL 2/3 (GAUZE/BANDAGES/DRESSINGS) ×2 IMPLANT
CHLORAPREP W/TINT 26ML (MISCELLANEOUS) ×3 IMPLANT
CLAMP CORD UMBIL (MISCELLANEOUS) IMPLANT
CLOSURE STERI-STRIP 1/2X4 (GAUZE/BANDAGES/DRESSINGS) ×1
CLOSURE WOUND 1/2 X4 (GAUZE/BANDAGES/DRESSINGS)
CLOTH BEACON ORANGE TIMEOUT ST (SAFETY) ×3 IMPLANT
CLSR STERI-STRIP ANTIMIC 1/2X4 (GAUZE/BANDAGES/DRESSINGS) ×1 IMPLANT
DRSG OPSITE POSTOP 4X10 (GAUZE/BANDAGES/DRESSINGS) ×3 IMPLANT
ELECT REM PT RETURN 9FT ADLT (ELECTROSURGICAL) ×3
ELECTRODE REM PT RTRN 9FT ADLT (ELECTROSURGICAL) ×1 IMPLANT
EXTENDER TRAXI PANNICULUS (MISCELLANEOUS) IMPLANT
EXTRACTOR VACUUM KIWI (MISCELLANEOUS) IMPLANT
GLOVE BIOGEL PI IND STRL 7.0 (GLOVE) ×1 IMPLANT
GLOVE BIOGEL PI IND STRL 9 (GLOVE) ×1 IMPLANT
GLOVE BIOGEL PI INDICATOR 7.0 (GLOVE) ×2
GLOVE BIOGEL PI INDICATOR 9 (GLOVE) ×2
GLOVE SS PI 9.0 STRL (GLOVE) ×3 IMPLANT
GOWN STRL REUS W/TWL 2XL LVL3 (GOWN DISPOSABLE) ×3 IMPLANT
GOWN STRL REUS W/TWL LRG LVL3 (GOWN DISPOSABLE) ×3 IMPLANT
NDL HYPO 25X5/8 SAFETYGLIDE (NEEDLE) IMPLANT
NEEDLE HYPO 25X5/8 SAFETYGLIDE (NEEDLE) IMPLANT
NS IRRIG 1000ML POUR BTL (IV SOLUTION) ×3 IMPLANT
PACK C SECTION WH (CUSTOM PROCEDURE TRAY) ×3 IMPLANT
PAD OB MATERNITY 4.3X12.25 (PERSONAL CARE ITEMS) ×3 IMPLANT
PENCIL SMOKE EVAC W/HOLSTER (ELECTROSURGICAL) ×3 IMPLANT
RETRACTOR TRAXI PANNICULUS (MISCELLANEOUS) IMPLANT
RTRCTR C-SECT PINK 25CM LRG (MISCELLANEOUS) IMPLANT
RTRCTR C-SECT PINK 34CM XLRG (MISCELLANEOUS) IMPLANT
STRIP CLOSURE SKIN 1/2X4 (GAUZE/BANDAGES/DRESSINGS) IMPLANT
SUT MNCRL 0 VIOLET CTX 36 (SUTURE) ×2 IMPLANT
SUT MONOCRYL 0 CTX 36 (SUTURE) ×4
SUT PLAIN 2 0 (SUTURE) ×6
SUT PLAIN ABS 2-0 CT1 27XMFL (SUTURE) IMPLANT
SUT VIC AB 0 CT1 27 (SUTURE) ×3
SUT VIC AB 0 CT1 27XBRD ANBCTR (SUTURE) ×1 IMPLANT
SUT VIC AB 2-0 CT1 27 (SUTURE) ×3
SUT VIC AB 2-0 CT1 TAPERPNT 27 (SUTURE) ×1 IMPLANT
SUT VIC AB 4-0 KS 27 (SUTURE) ×3 IMPLANT
SYR BULB IRRIGATION 50ML (SYRINGE) IMPLANT
TOWEL OR 17X24 6PK STRL BLUE (TOWEL DISPOSABLE) ×3 IMPLANT
TRAXI PANNICULUS EXTENDER (MISCELLANEOUS) ×2
TRAXI PANNICULUS RETRACTOR (MISCELLANEOUS) ×2
TRAY FOLEY W/BAG SLVR 14FR LF (SET/KITS/TRAYS/PACK) ×3 IMPLANT
WATER STERILE IRR 1000ML POUR (IV SOLUTION) ×3 IMPLANT

## 2018-07-18 NOTE — Lactation Note (Signed)
This note was copied from a baby's chart. Lactation Consultation Note  Patient Name: Lindsay Keith [id number] Date: 07/18/2018 Reason for consult: Initial assessment;NICU baby;Preterm <34wks;Other (Comment);Maternal endocrine disorder Type of Endocrine Disorder?: Thyroid(GDM )/ also high B/P - with labetalol .  Its been 26 years since moms last baby that she breast fed for 6 months / no pumping and when she went back to work  She weaned from the breast/ no issues with breast  Feeding.  Baby is in NICU and LC set up the DEBP and instructed mom on the initial setting after reviewing and instructing hand expressing.  When hand expressing 3 tiny drops. LC reassure mom that is normal. Mom mentioned she had several breast changes with pregnancy except the size of her breast did not increase a lot.  The #24 F accommodates the areola well for both breast and per mom comfortable.  LC recommended hand expressing prior to pumping and afterwards to enhance let down.  LC reviewed supply and demand and the importance of consistent pumping around the clock 8-10 times both breast for 15 - 20 mins / save milk. Colostrum storage containers provided/ and labels ( name ) and yellow dots for the 1st 24 pumping.  Per mom would like to sign up for [facility] / [place] and intended to and ended up getting admitted to have the baby.  LC will fax the information to [place] [facility] and explain the situation/ mom aware.  Mother informed of post-discharge support and given phone number to the lactation department, including services for phone call assistance; out-patient appointments; and breastfeeding support group. List of other breastfeeding resources in the community given in the handout. Encouraged mother to call for problems or concerns related to breastfeeding.   Maternal Data Has patient been taught Hand Expression?: Yes(tiny drops both breast ) Does the patient have breastfeeding experience prior to this delivery?:  Yes  Feeding    LATCH Score                   Interventions Interventions: Breast feeding basics reviewed;Hand express;DEBP  Lactation Tools Discussed/Used Tools: Pump;Flanges Flange Size: 24;Other (comment)(good fit / checked by [facility] ) Breast pump type: Double-Electric Breast Pump WIC Program: No(per mom would like to sign up with WIC - [place] / LC faxed form) Pump Review: Setup, frequency, and cleaning;Milk Storage Initiated by:: MAI  Date initiated:: 07/18/18   Consult Status Consult Status: Follow-up Date: 07/19/18 Follow-up type: In-patient    Matilde Sprang Shanena Pellegrino 07/18/2018, 1:19 PM

## 2018-07-18 NOTE — Op Note (Signed)
Please see the brief operative note for surgical details 

## 2018-07-18 NOTE — Transfer of Care (Signed)
Immediate Anesthesia Transfer of Care Note  Patient: Lindsay Keith  Procedure(s) Performed: CESAREAN SECTION (N/A )  Patient Location: PACU  Anesthesia Type:Spinal  Level of Consciousness: awake, alert  and oriented  Airway & Oxygen Therapy: Patient Spontanous Breathing  Post-op Assessment: Report given to RN and Post -op Vital signs reviewed and stable  Post vital signs: Reviewed and stable  Last Vitals:  Vitals Value Taken Time  BP 117/73   Temp    Pulse 87   Resp 14   SpO2 97     Last Pain:  Vitals:   07/18/18 0009  TempSrc: Oral  PainSc:       Patients Stated Pain Goal: 3 (07/17/18 2356)  Complications: No apparent anesthesia complications

## 2018-07-18 NOTE — Anesthesia Preprocedure Evaluation (Signed)
Anesthesia Evaluation  Patient identified by MRN, date of birth, ID band Patient awake    Reviewed: Allergy & Precautions, H&P , NPO status , Patient's Chart, lab work & pertinent test results, reviewed documented beta blocker date and time   Airway Mallampati: II  TM Distance: >3 FB Neck ROM: full    Dental no notable dental hx.    Pulmonary asthma , Current Smoker,    Pulmonary exam normal breath sounds clear to auscultation       Cardiovascular hypertension, Pt. on medications and Pt. on home beta blockers negative cardio ROS Normal cardiovascular exam Rhythm:regular Rate:Normal     Neuro/Psych negative neurological ROS  negative psych ROS   GI/Hepatic Neg liver ROS, GERD  Medicated,  Endo/Other  diabetesMorbid obesity  Renal/GU negative Renal ROS  negative genitourinary   Musculoskeletal   Abdominal   Peds  Hematology negative hematology ROS (+)   Anesthesia Other Findings   Reproductive/Obstetrics (+) Pregnancy                             Anesthesia Physical Anesthesia Plan  ASA: III and emergent  Anesthesia Plan: Spinal   Post-op Pain Management:    Induction:   PONV Risk Score and Plan: 2 and Treatment may vary due to age or medical condition  Airway Management Planned: Nasal Cannula  Additional Equipment:   Intra-op Plan:   Post-operative Plan:   Informed Consent: I have reviewed the patients History and Physical, chart, labs and discussed the procedure including the risks, benefits and alternatives for the proposed anesthesia with the patient or authorized representative who has indicated his/her understanding and acceptance.     Dental Advisory Given  Plan Discussed with: CRNA, Surgeon and Anesthesiologist  Anesthesia Plan Comments: (  )        Anesthesia Quick Evaluation

## 2018-07-18 NOTE — Anesthesia Procedure Notes (Signed)
Spinal  Patient location during procedure: OR Start time: 07/18/2018 1:15 AM End time: 07/18/2018 1:22 AM Staffing Anesthesiologist: Bethena Midget, MD Preanesthetic Checklist Completed: patient identified, site marked, surgical consent, pre-op evaluation, timeout performed, IV checked, risks and benefits discussed and monitors and equipment checked Spinal Block Patient position: sitting Prep: DuraPrep Patient monitoring: heart rate, cardiac monitor, continuous pulse ox and blood pressure Approach: midline Location: L2-3 Injection technique: single-shot Needle Needle type: Sprotte  Needle gauge: 22 G Needle length: 9 cm Assessment Sensory level: T4

## 2018-07-18 NOTE — Progress Notes (Signed)
FACULTY PRACTICE ANTEPARTUM(COMPREHENSIVE) NOTE  Lindsay Keith is a 43 y.o. [id number] at [redacted]w[redacted]d  who is admitted for BPP 6/10. After referral to MAU by MFM after initial BPP was 6/8 pt had a spontaneous decel while attempting to get the NST.  Blood pressures were severe range elevation, up to 180 systolic due to chronic hypertension so she was admitted for improved blood pressure control.  I was in antenatal area at approximately 12:30 AM and noted repetitive decelerations noted here to be late in character, spontaneous without any associated contractions.  Patient was taken urgently for a bedside BPP and after 20 minutes of effort there was no fetal breathing or fetal movement so, only 4 out of 10.  At this time the blood pressures were in the 130 systolic range after she had received several doses of labetalol and 1 dose of Apresoline during the 6 hours preceding the late decelerations.  Fetal decelerations into the 110 range were documented during the BPP efforts.  Decision was made to proceed with urgent cesarean section for nonreassuring fetal status Fetal presentation is cephalic. Length of Stay:  1  Days  Subjective: Patient denies headache scotoma Patient reports the fetal movement as decreased . Patient reports uterine contraction  activity as none. Patient reports  vaginal bleeding as none. Patient describes fluid per vagina as None.  Vitals:  Blood pressure 117/73, pulse 99, temperature 97.6 F (36.4 C), temperature source Oral, resp. rate 16, height 5\' 1"  (1.549 m), weight 112.1 kg, last menstrual period 10/15/2017, SpO2 99 %, unknown if currently breastfeeding. Physical Examination:  General appearance -  Heart - normal rate and regular rhythm Abdomen - soft, nontender, nondistended Fundal Height:  size less than dates Cervical Exam: Not evaluated. Extremities: extremities normal, atraumatic, no cyanosis or edema and Homans sign is negative, no sign of DVT with DTRs 2+  bilaterally Membranes:intact  Fetal Monitoring:  Baseline: 135 bpm, Variability: Fair (1-6 bpm), Accelerations: Nonreactive suspected lites and Decelerations: Late  Labs:  Results for orders placed or performed during the hospital encounter of 07/17/18 (from the past 24 hour(s))  Protein / creatinine ratio, urine   Collection Time: 07/17/18  6:29 PM  Result Value Ref Range   Creatinine, Urine 187.87 mg/dL   Total Protein, Urine 49 mg/dL   Protein Creatinine Ratio 0.26 (H) 0.00 - 0.15 mg/mg[Cre]  CBC   Collection Time: 07/17/18  6:39 PM  Result Value Ref Range   WBC 15.2 (H) 4.0 - 10.5 K/uL   RBC 4.28 3.87 - 5.11 MIL/uL   Hemoglobin 12.9 12.0 - 15.0 g/dL   HCT 45.4 09.8 - 11.9 %   MCV 93.2 80.0 - 100.0 fL   MCH 30.1 26.0 - 34.0 pg   MCHC 32.3 30.0 - 36.0 g/dL   RDW 14.7 82.9 - 56.2 %   Platelets 272 150 - 400 K/uL   nRBC 0.0 0.0 - 0.2 %  Comprehensive metabolic panel   Collection Time: 07/17/18  6:39 PM  Result Value Ref Range   Sodium 134 (L) 135 - 145 mmol/L   Potassium 4.2 3.5 - 5.1 mmol/L   Chloride 105 98 - 111 mmol/L   CO2 23 22 - 32 mmol/L   Glucose, Bld 104 (H) 70 - 99 mg/dL   BUN 7 6 - 20 mg/dL   Creatinine, Ser 1.30 0.44 - 1.00 mg/dL   Calcium 8.9 8.9 - 86.5 mg/dL   Total Protein 5.8 (L) 6.5 - 8.1 g/dL   Albumin 2.7 (L) 3.5 -  5.0 g/dL   AST 14 (L) 15 - 41 U/L   ALT 14 0 - 44 U/L   Alkaline Phosphatase 78 38 - 126 U/L   Total Bilirubin 0.3 0.3 - 1.2 mg/dL   GFR calc non Af Amer >60 >60 mL/min   GFR calc Af Amer >60 >60 mL/min   Anion gap 6 5 - 15  Type and screen [facility]   Collection Time: 07/17/18  6:39 PM  Result Value Ref Range   ABO/RH(D) O NEG    Antibody Screen POS    Sample Expiration 07/20/2018    Antibody Identification      PASSIVELY ACQUIRED ANTI-D Performed at [facility], [address] [place], [place] [address]    Unit Number [id number]    Blood Component Type RED CELLS,LR    Unit division 00    Status  of Unit ALLOCATED    Transfusion Status OK TO TRANSFUSE    Crossmatch Result COMPATIBLE    Unit Number [id number]    Blood Component Type RED CELLS,LR    Unit division 00    Status of Unit ALLOCATED    Transfusion Status OK TO TRANSFUSE    Crossmatch Result COMPATIBLE   BPAM RBC   Collection Time: 07/17/18  6:39 PM  Result Value Ref Range   Blood Product Unit Number [id number]    Unit Type and Rh 9500    Blood Product Expiration Date 492010071219    Blood Product Unit Number [id number]    Unit Type and Rh 9500    Blood Product Expiration Date [id number]   Glucose, capillary   Collection Time: 07/18/18  2:33 AM  Result Value Ref Range   Glucose-Capillary 190 (H) 70 - 99 mg/dL    IMedications:  Scheduled . [MAR Hold] aspirin EC  81 mg Oral Daily  . [MAR Hold] betamethasone acetate-betamethasone sodium phosphate  12 mg Intramuscular Q24 Hr x 2  . [MAR Hold] docusate sodium  100 mg Oral Daily  . [MAR Hold] enoxaparin (LOVENOX) injection  40 mg Subcutaneous Q24H  . [MAR Hold] famotidine  20 mg Oral BID  . [MAR Hold] labetalol  600 mg Oral Q8H  . [MAR Hold] prenatal multivitamin  1 tablet Oral Q1200   I have reviewed the patient's current medications.  ASSESSMENT: Patient Active Problem List   Diagnosis Date Noted  . Gestational diabetes mellitus 06/18/2018  . AMA (advanced maternal age) multigravida 35+ 05/25/2018  . Rh negative state in antepartum period 05/11/2018  . Chronic hypertension affecting pregnancy 05/11/2018  . BMI 40.0-44.9, adult (HCC) 05/11/2018  . Supervision of high risk pregnancy, antepartum 03/17/2018  . DEPRESSION, MAJOR 11/23/2007  . ESSENTIAL HYPERTENSION, BENIGN 11/23/2007  . History of herpes genitalis 09/08/2006  . Obesity in pregnancy 06/26/2006  . TOBACCO DEPENDENCE 06/26/2006  . HEMORRHOIDS, NOS 06/26/2006  . RHINITIS, ALLERGIC 06/26/2006  . ASTHMA, INTERMITTENT 06/26/2006  . GASTROESOPHAGEAL REFLUX, NO ESOPHAGITIS 06/26/2006     PLAN: After assessing fetal status recommends a pattern of repetitive late decelerations, decision was made to proceed with urgent cesarean section anesthesia notified.  Patient last ate 3 hours ago  Tilda Burrow 07/18/2018,2:37 AM    Patient ID: Osborne Casco, female   DOB: 24-Dec-1975, 43 y.o.   MRN: [id number]

## 2018-07-18 NOTE — Progress Notes (Signed)
After patient was admitted to unit she had multiple decelerations. Dr. Emelda Fear reviewed strip and ordered stat BPP. Results were 2/8 and stat c/s was called.

## 2018-07-18 NOTE — H&P (Signed)
Lindsay Camps, MD  Physician  Obstetrics  MAU Provider Note  Addendum  Date of Service:  07/17/2018 6:52 PM             Show:Clear all [x]Manual[x]Template[]Copied  Added by: [x]Phillip, Payton Mccallum, MD  []Hover for details  History   CSN: [id number]  Arrival date and time: 07/17/18 0165   First Provider Initiated Contact with Patient 07/17/18 1851         Chief Complaint  Patient presents with  . Hypertension   Lindsay Keith presents after being sent from MFM for elevated blood pressure She has a history of chronic hypertension that was not being treated for 2 years prior to pregnancy because she has not sought care During this pregnancy, she has been treated with labetalol, which has been titrated up Her current dose is 677m BID, which she reports 100% adherence  She denies headaches, vision changes, RUQ or epigastric pain, N/V Her last pregnancy was 26 years ago, and she did not have any blood pressure problems at that time She was diagnosed with HTN 10 years ago   Past Medical History:  Diagnosis Date  . Asthma   . Bartholin's gland abscess 02/27/2011  . Depression   . GERD (gastroesophageal reflux disease)   . Gestational diabetes mellitus 06/18/2018  . Hypertension          Past Surgical History:  Procedure Laterality Date  . WISDOM TOOTH EXTRACTION      History reviewed. No pertinent family history.  Social History        Tobacco Use  . Smoking status: Current Every Day Smoker    Packs/day: 0.25  . Smokeless tobacco: Never Used  Substance Use Topics  . Alcohol use: Not Currently  . Drug use: Yes    Types: Marijuana    Comment: Last marijuana Feb 2020    Allergies: No Known Allergies         Medications Prior to Admission  Medication Sig Dispense Refill Last Dose  . ACCU-CHEK FASTCLIX LANCETS MISC 1 Units by Percutaneous route 4 (four) times daily. 100 each 12 07/17/2018 at Unknown time  . Accu-Chek  FastClix Lancets MISC 1 each by Percutaneous route 4 (four) times daily. 100 each 12 07/17/2018 at Unknown time  . aspirin EC 81 MG tablet Take 1 tablet (81 mg total) by mouth daily. 60 tablet 2 07/17/2018 at Unknown time  . Blood Glucose Monitoring Suppl (ACCU-CHEK NANO SMARTVIEW) w/Device KIT 1 kit by Subdermal route as directed. Check blood sugars for fasting, and two hours after breakfast, lunch and dinner (4 checks daily) 1 kit 0 07/17/2018 at Unknown time  . Elastic Bandages & Supports (WRIST SPLINT/COCK-UP/LEFT M) MISC 1 Units by Does not apply route at bedtime. 1 each 0 07/17/2018 at Unknown time  . glucose blood test strip Four times per day 50 each 12 07/17/2018 at Unknown time  . labetalol (NORMODYNE) 200 MG tablet Take 3 tablets (600 mg total) by mouth 2 (two) times daily for 30 days. 180 tablet 0 07/17/2018 at Unknown time  . Prenatal Vit-Fe Fumarate-FA (PRENATAL MULTIVITAMIN) TABS tablet Take 1 tablet by mouth daily at 12 noon.   07/17/2018 at Unknown time  . ranitidine (ZANTAC) 150 MG capsule Take 1 capsule (150 mg total) by mouth 2 (two) times daily. 60 capsule 2 07/17/2018 at Unknown time    Review of Systems  All other systems reviewed and are negative.  Physical Exam   Blood pressure (!) 169/112, pulse 79, temperature 97.6  F (36.4 C), resp. rate 18, last menstrual period 10/15/2017.  Physical Exam  Constitutional: She is oriented to person, place, and time. She appears well-developed and well-nourished. No distress.  HENT:  Head: Normocephalic and atraumatic.  Eyes: Pupils are equal, round, and reactive to light. Conjunctivae and EOM are normal. Right eye exhibits no discharge. Left eye exhibits no discharge. No scleral icterus.  Cardiovascular: Normal rate.  Respiratory: Effort normal. No respiratory distress.  GI: Soft.  gravid  Neurological: She is alert and oriented to person, place, and time.  Skin: She is not diaphoretic.  Psychiatric: She has a normal mood and  affect. Her behavior is normal. Judgment and thought content normal.  Strip: baseline 140s/min-mod variability/no acels/occasional decel  MAU Course  Procedures  Patient with recurrent severe range blood pressures in the context of chronic hypertension  Prior severe range pressures treated with MAU monitoring, PRN meds and was discharged home in stable condition However, has nonreactive NST with decelerations Will admit for blood pressure and fetal monitoring, which patient is agreeable  Preeclampsia labs ordered   Assessment and Plan  Chronic hypertension with exacerbation during pregnancy in third trimester - intermittent severe range blood pressures - admit to antepartum for blood pressure and fetal monitoring - increase labetalol from 631m BID to 6044mTID - betamethasone x 2 - GBS culture - BPP earlier today 8/10 (nonreactive NST) - consider repeat BPP later in admission  NiAura Keith/20/2020, 6:52 PM     Cosigned by: FeJonnie KindMD at 07/17/2018 11:52 PM  Electronically signed by PhAura CampsMD at 07/17/2018 9:27 PM Electronically signed by PhAura CampsMD at 07/17/2018 9:33 PM Electronically signed by FeJonnie KindMD at 07/17/2018 11:52 PM

## 2018-07-18 NOTE — Progress Notes (Signed)
Subjective: Postpartum Day 0: Cesarean Delivery Patient reports incisional pain and tolerating PO.   Mild incision pain only Objective: Vital signs in last 24 hours: Temp:  [97 F (36.1 C)-98 F (36.7 C)] 97.9 F (36.6 C) (03/21 0653) Pulse Rate:  [77-99] 80 (03/21 0653) Resp:  [15-21] 18 (03/21 0653) BP: (117-199)/(69-129) 122/100 (03/21 0653) SpO2:  [97 %-100 %] 98 % (03/21 0653) Weight:  [112.1 kg] 112.1 kg (03/21 0009)  Currently while on Norvasc since delivery BP (!) 122/100 (BP Location: Right Arm)   Pulse 80   Temp 97.9 F (36.6 C) (Oral)   Resp 18   Ht 5\' 1"  (1.549 m)   Wt 112.1 kg   LMP 10/15/2017   SpO2 98%   Breastfeeding Unknown   BMI 46.70 kg/m   Physical Exam:  General: alert, cooperative, appears stated age and no distress Lochia: appropriate Uterine Fundus: nontender, cannot feel due to pannus Incision: healing well, no significant drainage, no dehiscence, no significant erythema DVT Evaluation: No evidence of DVT seen on physical exam.  Recent Labs    07/17/18 1839 07/18/18 0610  HGB 12.9 13.0  HCT 39.9 41.4    Assessment/Plan: Status post Cesarean section. Doing well postoperatively. Chronic hypertension currently stable pressures.  Will continue on Norvasc 10. Did not tx with Mag sulfate. Pt was on Lisinopril prior to pregnancy, but states she felt "weird" on it and wishes to stay on this med if she continues to feel well.Tilda Burrow 07/18/2018, 7:26 AM

## 2018-07-18 NOTE — Brief Op Note (Signed)
07/18/2018  2:46 AM  PATIENT:  Lindsay Keith  43 y.o. female  PRE-OPERATIVE DIAGNOSIS: Pregnancy 33 weeks 5 days, nonreassuring fetal status, primary CESAREAN SECTION low transverse incision  POST-OPERATIVE DIAGNOSIS: Primary low transverse CESAREAN SECTION nonreassuring fetal status  PROCEDURE:  Procedure(s): CESAREAN SECTION (N/A) primary low transverse  SURGEON:  Surgeon(s) and Role:    * Tilda Burrow, MD - Primary  PHYSICIAN ASSISTANT:   ASSISTANTS: none   ANESTHESIA:   spinal  EBL:  59 mL   BLOOD ADMINISTERED:none  DRAINS: Urinary Catheter (Foley)   LOCAL MEDICATIONS USED:  NONE  SPECIMEN:  Source of Specimen:  Placenta delivery delivery  DISPOSITION OF SPECIMEN:  Labor and delivery  COUNTS:  YES  TOURNIQUET:  * No tourniquets in log *  DICTATION: .Dragon Dictation  PLAN OF CARE: Has admission orders  PATIENT DISPOSITION:  PACU - hemodynamically stable.   Delay start of Pharmacological VTE agent (>24hrs) due to surgical blood loss or risk of bleeding: not applicable Details of procedure  Patient was taken operating in urgent fashion spinal anesthesia achieved and patient rapidly Foley catheter placed abdomen prepped with Betadine Ancef administered timeout conducted and seizure confirmed.  Transverse lower abdominal incision was made with Traxi in place to allow access to the lower abdomen.  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  The left fallopian tube and ovary were visible right in the center of the opening after the Jon Gills was in place.  Uterus could be rotated counterclockwise and the lower uterine segment visualized.  Transverse  uterine neck was made, completed by blunt dissection and the uterine cavity, extended transversely and cephalad and caudad.  Fetal vertex was well engaged in the lower uterine segment could be rotated into the incision and delivered easily.  Amniotic fluid was clear without malodor.  The uterine incision was transverse with an inferior extension about 2 cm on the patient's left side.  The baby was delivered without difficulty, placenta delivered intact, swabbed out, the closure was a running locking 2 layer closure beginning in the inferior aspects of the left-sided inferior uterine opening second layer closure was continuous running 0 Monocryl.  Good tissue approximation was achieved and good hemostasis.  Abdomen was irrigated, and the bowel run to inspect the sides to make sure it did not slipped into the area in front of the uterus and been injured during the initial surgery, as the patient's uterus was quite small altretamine moved past the uterus during the early part of the case.  The small bowel was run along its entirety and there were no areas of concern. Anterior peritoneum was closed with running 2-0 Vicryl, the fascia closed with running 0 Vicryl subcutaneous closure with 2 layers of horizontal mattress sutures of 2 oh plain, then, sponge and needle counts correct upon completion of closure of the skin with 4-0 Vicryl

## 2018-07-19 ENCOUNTER — Encounter (HOSPITAL_COMMUNITY): Payer: Self-pay | Admitting: Obstetrics and Gynecology

## 2018-07-19 LAB — GLUCOSE, CAPILLARY: Glucose-Capillary: 148 mg/dL — ABNORMAL HIGH (ref 70–99)

## 2018-07-19 NOTE — Lactation Note (Signed)
This note was copied from a baby's chart. Lactation Consultation Note  Patient Name: Girl Xola Beiter ALPFX'T Date: 07/19/2018 Reason for consult: Follow-up assessment;NICU baby;Preterm <34wks;Other (Comment)(mom sound asleep/ family member awake resting on the couch ) Type of Endocrine Disorder?: Thyroid(GDM )   Maternal Data    Feeding Feeding Type: Donor Breast Milk  LATCH Score                   Interventions Interventions: Breast feeding basics reviewed  Lactation Tools Discussed/Used Tools: Pump Breast pump type: Double-Electric Breast Pump   Consult Status Consult Status: Follow-up Date: 07/19/18 Follow-up type: In-patient    Matilde Sprang Nikolaj Geraghty 07/19/2018, 2:28 PM

## 2018-07-19 NOTE — Anesthesia Postprocedure Evaluation (Signed)
Anesthesia Post Note  Patient: Lindsay Keith  Procedure(s) Performed: CESAREAN SECTION (N/A )     Patient location during evaluation: PACU Anesthesia Type: Spinal Level of consciousness: oriented and awake and alert Pain management: pain level controlled Vital Signs Assessment: post-procedure vital signs reviewed and stable Respiratory status: spontaneous breathing, respiratory function stable and patient connected to nasal cannula oxygen Cardiovascular status: blood pressure returned to baseline and stable Postop Assessment: no headache, no backache and no apparent nausea or vomiting Anesthetic complications: no    Last Vitals:  Vitals:   07/19/18 0500 07/19/18 0841  BP: (!) 149/78 (!) 155/85  Pulse: 88 96  Resp:  18  Temp: 36.5 C 36.5 C  SpO2: 97% 99%    Last Pain:  Vitals:   07/19/18 0841  TempSrc: Oral  PainSc:    Pain Goal: Patients Stated Pain Goal: 3 (07/17/18 2356)              Epidural/Spinal Function Cutaneous sensation: Normal sensation (07/19/18 0830), Patient able to flex knees: Yes (07/19/18 0830), Patient able to lift hips off bed: Yes (07/19/18 0830), Back pain beyond tenderness at insertion site: No (07/19/18 0830), Progressively worsening motor and/or sensory loss: No (07/19/18 0830), Bowel and/or bladder incontinence post epidural: No (07/19/18 0830)  Mikiah Demond

## 2018-07-19 NOTE — Lactation Note (Signed)
This note was copied from a baby's chart. Lactation Consultation Note  Patient Name: Lindsay Keith [id number] Date: 07/19/2018 Reason for consult: Follow-up assessment;Preterm <34wks;NICU baby(encouraged mom to increase her pumping 8-10 times in 24 hours - see LC note ) Type of Endocrine Disorder?: Thyroid(GDM )  Baby is 37 hours old in NICU .  Per mom pumped 3 x's after seeing LC yesterday and has not pumped today as of yet .  LC reviewed supply and demand and the importance of consistent pumping both breast  For 15- 20 mins 8-10 times in 24 hours. Hand express prior to pumping and after wards.  Save milk and take the milk to NICU.  LC reminded mom it can be a slow process and to consistency is the key to establishing milk supply And increasing let down.  Yesterday [facility] faxed the request for mom to be signed up with [facility] [place].     Maternal Data    Feeding Feeding Type: Donor Breast Milk  LATCH Score                   Interventions Interventions: Breast feeding basics reviewed  Lactation Tools Discussed/Used Tools: Pump Flange Size: 24 Breast pump type: Double-Electric Breast Pump   Consult Status Consult Status: Follow-up Date: 07/20/18 Follow-up type: In-patient    Lindsay Keith 07/19/2018, 3:03 PM

## 2018-07-19 NOTE — Progress Notes (Signed)
Subjective: Postpartum Day 1: Cesarean Delivery Patient reports feeling well without chest pain, shortness of breath, lightheadedness/dizziness. She is ambulating, voiding and tolerating a regular diet  Objective: Vital signs in last 24 hours: Temp:  [97.7 F (36.5 C)-98 F (36.7 C)] 97.7 F (36.5 C) (03/22 0500) Pulse Rate:  [84-90] 88 (03/22 0500) Resp:  [17-18] 17 (03/21 1940) BP: (121-176)/(68-101) 149/78 (03/22 0500) SpO2:  [97 %-99 %] 97 % (03/22 0500)  Physical Exam:  General: alert, cooperative and no distress Lochia: appropriate Uterine Fundus: firm Incision: honeycomb dressing clean/dry and intact DVT Evaluation: No evidence of DVT seen on physical exam.  Recent Labs    07/17/18 1839 07/18/18 0610  HGB 12.9 13.0  HCT 39.9 41.4    Assessment/Plan: Status post Cesarean section. Doing well postoperatively.  Continue Norvasc for CHTN Continue routine postpartum care.  Lindsay Keith 07/19/2018, 7:56 AM

## 2018-07-20 ENCOUNTER — Telehealth (HOSPITAL_COMMUNITY): Payer: Self-pay | Admitting: Lactation Services

## 2018-07-20 LAB — RH IG WORKUP (INCLUDES ABO/RH)
ABO/RH(D): O NEG
Fetal Screen: NEGATIVE
Gestational Age(Wks): 33
Unit division: 0

## 2018-07-20 MED ORDER — ENALAPRIL MALEATE 2.5 MG PO TABS
2.5000 mg | ORAL_TABLET | Freq: Every day | ORAL | Status: DC
  Administered 2018-07-20: 2.5 mg via ORAL
  Filled 2018-07-20: qty 1

## 2018-07-20 MED ORDER — ONDANSETRON HCL 4 MG/2ML IJ SOLN: 4.0000 mg | Freq: Three times a day (TID) | INTRAMUSCULAR | Status: DC | PRN

## 2018-07-20 MED ORDER — SCOPOLAMINE 1 MG/3DAYS TD PT72
1.0000 | MEDICATED_PATCH | Freq: Once | TRANSDERMAL | Status: DC
  Filled 2018-07-20: qty 1

## 2018-07-20 MED ORDER — SODIUM CHLORIDE 0.9% FLUSH
3.0000 mL | INTRAVENOUS | Status: DC | PRN
  Administered 2018-07-20: 3 mL via INTRAVENOUS
  Filled 2018-07-20: qty 3

## 2018-07-20 MED ORDER — NALBUPHINE HCL 10 MG/ML IJ SOLN: 5.0000 mg | Freq: Once | INTRAMUSCULAR | Status: DC | PRN

## 2018-07-20 MED ORDER — KETOROLAC TROMETHAMINE 30 MG/ML IJ SOLN: 30.0000 mg | Freq: Four times a day (QID) | INTRAMUSCULAR | Status: AC | PRN

## 2018-07-20 MED ORDER — NALBUPHINE HCL 10 MG/ML IJ SOLN: 5.0000 mg | INTRAMUSCULAR | Status: DC | PRN

## 2018-07-20 MED ORDER — NALOXONE HCL 4 MG/10ML IJ SOLN
1.0000 ug/kg/h | INTRAVENOUS | Status: DC | PRN
  Filled 2018-07-20: qty 5

## 2018-07-20 MED ORDER — NALOXONE HCL 0.4 MG/ML IJ SOLN: 0.4000 mg | INTRAMUSCULAR | Status: DC | PRN

## 2018-07-20 MED ORDER — DIPHENHYDRAMINE HCL 50 MG/ML IJ SOLN: 12.5000 mg | INTRAMUSCULAR | Status: DC | PRN

## 2018-07-20 MED ORDER — DIPHENHYDRAMINE HCL 25 MG PO CAPS: 25.0000 mg | ORAL_CAPSULE | ORAL | Status: DC | PRN

## 2018-07-20 NOTE — Progress Notes (Signed)
Subjective: Postpartum Day 2: Cesarean Delivery Patient reports incisional pain, tolerating PO and + flatus.    Objective: Vital signs in last 24 hours: Temp:  [97.7 F (36.5 C)-98.5 F (36.9 C)] 98.5 F (36.9 C) (03/23 0431) Pulse Rate:  [93-104] 101 (03/23 0539) Resp:  [16-20] 20 (03/23 0539) BP: (139-159)/(78-99) 139/85 (03/23 0539) SpO2:  [98 %-100 %] 99 % (03/23 0431)  Physical Exam:  General: alert, cooperative and appears stated age Lochia: appropriate Uterine Fundus: firm Incision: dressing intact DVT Evaluation: No evidence of DVT seen on physical exam.  Recent Labs    07/17/18 1839 07/18/18 0610  HGB 12.9 13.0  HCT 39.9 41.4    Assessment/Plan: Status post Cesarean section. Doing well postoperatively.  Add vasotec to Norvasc for BP control. Anticipate dc in am.  Lindsay Keith 07/20/2018, 7:59 AM

## 2018-07-20 NOTE — Lactation Note (Signed)
This note was copied from a baby's chart. Lactation Consultation Note  Patient Name: Lindsay Keith [id number] Date: 07/20/2018 Reason for consult: Follow-up assessment;Primapara;Late-preterm 34-36.6wks;NICU baby;Other (Comment)(mom on the phone with [facility] [place] - will revisit her today ) Type of Endocrine Disorder?: Thyroid(GDM )   Maternal Data    Feeding Feeding Type: Donor Breast Milk  LATCH Score                   Interventions    Lactation Tools Discussed/Used     Consult Status Consult Status: Follow-up Date: 07/20/18 Follow-up type: In-patient    Matilde Sprang Ludie Hudon 07/20/2018, 12:09 PM

## 2018-07-21 ENCOUNTER — Encounter: Payer: Self-pay | Admitting: *Deleted

## 2018-07-21 LAB — CBC
HCT: 40.3 % (ref 36.0–46.0)
Hemoglobin: 12.5 g/dL (ref 12.0–15.0)
MCH: 28.6 pg (ref 26.0–34.0)
MCHC: 31 g/dL (ref 30.0–36.0)
MCV: 92.2 fL (ref 80.0–100.0)
NRBC: 0 % (ref 0.0–0.2)
Platelets: 319 10*3/uL (ref 150–400)
RBC: 4.37 MIL/uL (ref 3.87–5.11)
RDW: 13.3 % (ref 11.5–15.5)
WBC: 16.3 10*3/uL — ABNORMAL HIGH (ref 4.0–10.5)

## 2018-07-21 LAB — COMPREHENSIVE METABOLIC PANEL
ALT: 23 U/L (ref 0–44)
ANION GAP: 12 (ref 5–15)
AST: 26 U/L (ref 15–41)
Albumin: 2.8 g/dL — ABNORMAL LOW (ref 3.5–5.0)
Alkaline Phosphatase: 72 U/L (ref 38–126)
BUN: 7 mg/dL (ref 6–20)
CO2: 28 mmol/L (ref 22–32)
Calcium: 10 mg/dL (ref 8.9–10.3)
Chloride: 97 mmol/L — ABNORMAL LOW (ref 98–111)
Creatinine, Ser: 0.56 mg/dL (ref 0.44–1.00)
GFR calc non Af Amer: >60 mL/min (ref >60)
Glucose, Bld: 106 mg/dL — ABNORMAL HIGH (ref 70–99)
Potassium: 3.6 mmol/L (ref 3.5–5.1)
Sodium: 137 mmol/L (ref 135–145)
Total Bilirubin: 0.6 mg/dL (ref 0.3–1.2)
Total Protein: 6.4 g/dL — ABNORMAL LOW (ref 6.5–8.1)

## 2018-07-21 LAB — BPAM RBC
BLOOD PRODUCT EXPIRATION DATE: 202004102359
Blood Product Expiration Date: 202004102359
UNIT TYPE AND RH: 9500
Unit Type and Rh: 9500

## 2018-07-21 LAB — TYPE AND SCREEN
ABO/RH(D): O NEG
Antibody Screen: POSITIVE
Unit division: 0
Unit division: 0

## 2018-07-21 MED ORDER — ENALAPRIL MALEATE 5 MG PO TABS
5.0000 mg | ORAL_TABLET | Freq: Every day | ORAL | Status: DC
  Administered 2018-07-21 – 2018-07-22 (×2): 5 mg via ORAL
  Filled 2018-07-21 (×2): qty 1

## 2018-07-21 MED ORDER — AMLODIPINE BESYLATE 10 MG PO TABS
10.0000 mg | ORAL_TABLET | Freq: Every day | ORAL | Status: DC
  Administered 2018-07-21 – 2018-07-22 (×2): 10 mg via ORAL
  Filled 2018-07-21 (×2): qty 1

## 2018-07-21 NOTE — Progress Notes (Signed)
Faculty Attending Note  Post Op Day 3  Subjective: Patient is feeling well. She reports well controlled pain on PO pain meds. Had headache this morning that improved when BP improved. She is ambulating and denies light-headedness or dizziness. She is voiding spontaneously. She is passing flatus. She is tolerating a regular diet without nausea/vomiting. Bleeding is moderate. She is breast & bottle feeding. Baby is in NICU and doing well.  Objective: Blood pressure 139/76, pulse 94, temperature 98.1 F (36.7 C), temperature source Oral, resp. rate 16, height 5\' 1"  (1.549 m), weight 112.1 kg, last menstrual period 10/15/2017, SpO2 100 %, unknown if currently breastfeeding. Temp:  [98.1 F (36.7 C)-98.8 F (37.1 C)] 98.1 F (36.7 C) (03/24 0509) Pulse Rate:  [84-124] 94 (03/24 0700) Resp:  [16-20] 16 (03/24 0509) BP: (139-191)/(68-106) 139/76 (03/24 0700) SpO2:  [94 %-100 %] 100 % (03/23 2134)  Physical Exam:  General: alert, oriented, cooperative Chest: normal respiratory effort Heart: RRR  Abdomen: soft, appropriately tender to palpation, incision covered by dressing with no evidence of active bleeding  Uterine Fundus: firm, 2 fingers below the umbilicus Lochia: moderate, rubra DVT Evaluation: no evidence of DVT Extremities: no edema, no calf tenderness  UOP: voiding spontaneously   Current Facility-Administered Medications:  .  acetaminophen (TYLENOL) tablet 650 mg, 650 mg, Oral, Q4H PRN, Gwenevere Abbot, MD, 650 mg at 07/21/18 0653 .  amLODipine (NORVASC) tablet 10 mg, 10 mg, Oral, Daily, Conan Bowens, MD, 10 mg at 07/21/18 [phone] .  calcium carbonate (TUMS - dosed in mg elemental calcium) chewable tablet 400 mg of elemental calcium, 2 tablet, Oral, Q4H PRN, Gwenevere Abbot, MD .  coconut oil, 1 application, Topical, PRN, Tilda Burrow, MD .  witch hazel-glycerin (TUCKS) pad 1 application, 1 application, Topical, PRN **AND** dibucaine (NUPERCAINAL) 1 % rectal ointment 1  application, 1 application, Rectal, PRN, Tilda Burrow, MD .  diphenhydrAMINE (BENADRYL) injection 12.5 mg, 12.5 mg, Intravenous, Q4H PRN **OR** diphenhydrAMINE (BENADRYL) capsule 25 mg, 25 mg, Oral, Q4H PRN, Bethena Midget, MD .  diphenhydrAMINE (BENADRYL) capsule 25 mg, 25 mg, Oral, Q6H PRN, Tilda Burrow, MD, 25 mg at 07/18/18 6060 .  enalapril (VASOTEC) tablet 5 mg, 5 mg, Oral, Daily, Conan Bowens, MD .  enoxaparin (LOVENOX) injection 55 mg, 55 mg, Subcutaneous, Q24H, Tilda Burrow, MD, 55 mg at 07/20/18 2212 .  hydrALAZINE (APRESOLINE) injection 5 mg, 5 mg, Intravenous, PRN, 5 mg at 07/21/18 0539 **AND** hydrALAZINE (APRESOLINE) injection 10 mg, 10 mg, Intravenous, PRN, 10 mg at 07/21/18 0554 **AND** labetalol (NORMODYNE,TRANDATE) injection 20 mg, 20 mg, Intravenous, PRN, 20 mg at 07/21/18 0655 **AND** labetalol (NORMODYNE,TRANDATE) injection 40 mg, 40 mg, Intravenous, PRN **AND** [COMPLETED] Measure blood pressure, , , Once, Tilda Burrow, MD .  ketorolac (TORADOL) 30 MG/ML injection 30 mg, 30 mg, Intravenous, Q6H PRN **OR** ketorolac (TORADOL) 30 MG/ML injection 30 mg, 30 mg, Intramuscular, Q6H PRN, Bethena Midget, MD .  lactated ringers infusion, , Intravenous, Continuous, Tilda Burrow, MD .  menthol-cetylpyridinium (CEPACOL) lozenge 3 mg, 1 lozenge, Oral, Q2H PRN, Tilda Burrow, MD .  nalbuphine (NUBAIN) injection 5 mg, 5 mg, Intravenous, Q4H PRN **OR** nalbuphine (NUBAIN) injection 5 mg, 5 mg, Subcutaneous, Q4H PRN, Bethena Midget, MD .  nalbuphine (NUBAIN) injection 5 mg, 5 mg, Intravenous, Once PRN **OR** nalbuphine (NUBAIN) injection 5 mg, 5 mg, Subcutaneous, Once PRN, Bethena Midget, MD .  naloxone [facility]) injection 0.4 mg, 0.4 mg, Intravenous, PRN **AND** sodium chloride flush (NS)  0.9 % injection 3 mL, 3 mL, Intravenous, PRN, Bethena Midget, MD, 3 mL at 07/20/18 0904 .  naloxone HCl (NARCAN) 2 mg in dextrose 5 % 250 mL infusion, 1-4 mcg/kg/hr, Intravenous, Continuous  PRN, Bethena Midget, MD .  ondansetron [facility]) injection 4 mg, 4 mg, Intravenous, Q8H PRN, Bethena Midget, MD .  oxyCODONE (Oxy IR/ROXICODONE) immediate release tablet 5-10 mg, 5-10 mg, Oral, Q4H PRN, Tilda Burrow, MD, 10 mg at 07/20/18 0435 .  prenatal multivitamin tablet 1 tablet, 1 tablet, Oral, Q1200, Tilda Burrow, MD, 1 tablet at 07/20/18 1316 .  scopolamine (TRANSDERM-SCOP) 1 MG/3DAYS 1.5 mg, 1 patch, Transdermal, Once, Bethena Midget, MD .  senna-docusate (Senokot-S) tablet 2 tablet, 2 tablet, Oral, Q24H, Tilda Burrow, MD, 2 tablet at 07/21/18 0016 .  simethicone (MYLICON) chewable tablet 80 mg, 80 mg, Oral, TID PC, Tilda Burrow, MD, 80 mg at 07/20/18 1759 .  simethicone (MYLICON) chewable tablet 80 mg, 80 mg, Oral, Q24H, Tilda Burrow, MD, 80 mg at 07/21/18 0016 .  simethicone (MYLICON) chewable tablet 80 mg, 80 mg, Oral, PRN, Tilda Burrow, MD .  Tdap (BOOSTRIX) injection 0.5 mL, 0.5 mL, Intramuscular, Once, Tilda Burrow, MD .  traMADol Janean Sark) tablet 50 mg, 50 mg, Oral, Q6H PRN, Tilda Burrow, MD .  zolpidem (AMBIEN) tablet 5 mg, 5 mg, Oral, QHS PRN, Tilda Burrow, MD Recent Labs    07/21/18 0539  HGB 12.5  HCT 40.3    Assessment/Plan:  Patient is 43 y.o. [id number] POD#3 s/p 1LTCS at [redacted]w[redacted]d for non-reassuring fetal status. Course complicated by exacerbation of cHTN. Requiring multiple IV pushes overnight and this am, gave norvasc early and increased vasotec this am. She is otherwise doing well, recovering appropriately and complains only of mild soreness.   Continue routine post partum care Pain meds prn Regular diet declines birth control Plan for discharge likely tomorrow   K. Therese Sarah, M.D. Attending Center for [employer] (Faculty Practice)  07/21/2018, 7:21 AM

## 2018-07-21 NOTE — Lactation Note (Signed)
This note was copied from a baby's chart. Lactation Consultation Note  Patient Name: Girl Jolett Sneed [id number] Date: 07/21/2018 Reason for consult: Follow-up assessment;Preterm <34wks;NICU baby;Other (Comment);Infant < 6lbs(mom's D/C is being held due to B/P elevation ) Type of Endocrine Disorder?: Thyroid(GDM )  LC visited mom in her room and she had recently pumped 2- 3 ml.  Per mom has pumped x 3 in the last 24 hours/ and seems to get more after seeing baby.  Mom denies soreness / sore nipple and engorgement prevention and tx.  [facility] [place] called mom yesterday and she will have an appt tomorrow at 11:30 am to pick  Up her DEBP.  LC stressed the importance rest / naps / plenty fluids/ and consistent pumping around the clock To enhance let down and establish milk supply.  Mom expressed feelings of anxiety - LC reassured her and recommended if she increased her  Pumping the hormone oxytocin would increase and help her relax.  Mom using #24 Flange and its comfortable.   Maternal Data Has patient been taught Hand Expression?: Yes  Feeding Feeding Type: Donor Breast Milk  LATCH Score                   Interventions Interventions: Breast feeding basics reviewed;DEBP  Lactation Tools Discussed/Used Tools: Pump Flange Size: 24(per mom still using a #24 F and its comfortable ) Breast pump type: Double-Electric Breast Pump   Consult Status Consult Status: Follow-up Date: 07/22/18 Follow-up type: In-patient    Matilde Sprang Champayne Kocian 07/21/2018, 9:52 AM

## 2018-07-21 NOTE — Progress Notes (Signed)
CSW attempted to meet with MOB at bedside to complete psychosocial assessment, however MOB was asleep. CSW will attempt to meet with MOB at a later time.  Celso Sickle, LCSW Clinical Social Worker [facility] Cell#: [phone]

## 2018-07-22 MED ORDER — AMLODIPINE BESYLATE 10 MG PO TABS: 10.0000 mg | ORAL_TABLET | Freq: Every day | ORAL | 1 refills | Status: DC

## 2018-07-22 MED ORDER — IBUPROFEN 600 MG PO TABS: 600.0000 mg | ORAL_TABLET | Freq: Four times a day (QID) | ORAL | 1 refills | Status: DC | PRN

## 2018-07-22 MED ORDER — OXYCODONE-ACETAMINOPHEN 5-325 MG PO TABS: 1.0000 | ORAL_TABLET | Freq: Four times a day (QID) | ORAL | 0 refills | Status: DC | PRN

## 2018-07-22 MED ORDER — ENALAPRIL MALEATE 5 MG PO TABS: 5.0000 mg | ORAL_TABLET | Freq: Every day | ORAL | 1 refills | Status: DC

## 2018-07-22 NOTE — Discharge Summary (Signed)
OB Discharge Summary     Patient Name: Lindsay Keith DOB: 09/17/1975 MRN: [id number]  Date of admission: 07/17/2018 Delivering MD: Tilda Burrow   Date of discharge: 07/22/2018  Admitting diagnosis: IUP 33 weeks, CHTN Intrauterine pregnancy: [redacted]w[redacted]d     Secondary diagnosis:  Active Problems:   Chronic hypertension affecting pregnancy Non reassuring FHT's Additional problems: None     Discharge diagnosis: Preterm Pregnancy Delivered, CHTN and GDM A1                                                                                                Post partum procedures:LTCS  Augmentation: None  Complications: None  Hospital course:  Lindsay Keith was admitted for [facility]. On night of admission developed non reassuring FHT's. BPP was 4/10. Pt was taken to OR for LTCS. See OP note for additional information. Postpartum course was complicated by BP control. She received magnesium therapy x 24 hrs. Was started on Norvasc and required addition of Vasotec. BP responded to this regiment. BP at time of discharge was 130-150's/ 70-80's She otherwise progressed to ambulating voiding, tolerating diet, good oral pain control and + flatus. On POD # 4 felt pt was amendable for discharge home. Discharge medications, instructions and follow up were reviewed with pt. She verbalized understanding.    Physical exam  Vitals:   07/22/18 0325 07/22/18 0730 07/22/18 0932 07/22/18 1135  BP: (!) 141/76 (!) 153/83 139/85 128/82  Pulse: (!) 105 98 (!) 108 (!) 101  Resp: 18 20  20   Temp: 97.7 F (36.5 C) 98 F (36.7 C)  98 F (36.7 C)  TempSrc: Oral Oral  Oral  SpO2: 98% 94%  94%  Weight:      Height:       General: alert Lochia: appropriate Uterine Fundus: firm Incision: Healing well with no significant drainage DVT Evaluation: No evidence of DVT seen on physical exam. Labs: Lab Results  Component Value Date   WBC 16.3 (H) 07/21/2018   HGB 12.5 07/21/2018   HCT 40.3 07/21/2018    MCV 92.2 07/21/2018   PLT 319 07/21/2018   CMP Latest Ref Rng & Units 07/21/2018  Glucose 70 - 99 mg/dL 299(B)  BUN 6 - 20 mg/dL 7  Creatinine 7.16 - 9.67 mg/dL 8.93  Sodium 810 - 175 mmol/L 137  Potassium 3.5 - 5.1 mmol/L 3.6  Chloride 98 - 111 mmol/L 97(L)  CO2 22 - 32 mmol/L 28  Calcium 8.9 - 10.3 mg/dL 10.2  Total Protein 6.5 - 8.1 g/dL 6.4(L)  Total Bilirubin 0.3 - 1.2 mg/dL 0.6  Alkaline Phos 38 - 126 U/L 72  AST 15 - 41 U/L 26  ALT 0 - 44 U/L 23    Discharge instruction: per After Visit Summary and "Baby and Me Booklet".  After visit meds:  Allergies as of 07/22/2018   No Known Allergies     Medication List    STOP taking these medications   glucose blood test strip   labetalol 200 MG tablet Commonly known as:  NORMODYNE     TAKE these medications  amLODipine 10 MG tablet Commonly known as:  NORVASC Take 1 tablet (10 mg total) by mouth daily. Start taking on:  July 23, 2018   aspirin EC 81 MG tablet Take 1 tablet (81 mg total) by mouth daily.   enalapril 5 MG tablet Commonly known as:  VASOTEC Take 1 tablet (5 mg total) by mouth daily. Start taking on:  July 23, 2018   ibuprofen 600 MG tablet Commonly known as:  ADVIL,MOTRIN Take 1 tablet (600 mg total) by mouth every 6 (six) hours as needed for mild pain.   oxyCODONE-acetaminophen 5-325 MG tablet Commonly known as:  PERCOCET/ROXICET Take 1 tablet by mouth every 6 (six) hours as needed for severe pain.   prenatal multivitamin Tabs tablet Take 1 tablet by mouth daily at 12 noon.   ranitidine 150 MG capsule Commonly known as:  ZANTAC Take 1 capsule (150 mg total) by mouth 2 (two) times daily.            Discharge Care Instructions  (From admission, onward)         Start     Ordered   07/22/18 0000  Discharge wound care:    Comments:  Remove dressing on 07/26/18   07/22/18 1224          Diet: routine diet  Activity: Advance as tolerated. Pelvic rest for 6 weeks.    Outpatient follow up:1 week for BP and incision check Follow up Appt: Future Appointments  Date Time Provider Department Center  08/03/2018  9:20 AM [facility] NURSE [facility]  08/20/2018  9:35 AM Adam Phenix, MD [facility]   Follow up Visit:No follow-ups on file.  Postpartum contraception: None  Newborn Data: Live born female  Birth Weight: 3 lb 7.4 oz (1570 g) APGAR: 8, 9  Newborn Delivery   Birth date/time:  07/18/2018 01:28:00 Delivery type:  C-Section, Low Transverse C-section categorization:  Primary     Baby Feeding: Breast Disposition:NICU   07/22/2018 Lindsay Staggers, MD

## 2018-07-22 NOTE — Progress Notes (Signed)
Discharge instructions reviewed with patient including follow up appointments for BP and PP check, medication changes and times, postpartum care and signs and symptoms of hypertension.  PreE pamphlet given and explained to patient.

## 2018-07-22 NOTE — Lactation Note (Signed)
This note was copied from a baby's chart. Lactation Consultation Note  Patient Name: Lindsay Keith Today's Date: 07/22/2018   Baby 74 days old in NICU. < 4 lbs.  Mother canceled [facility] appt due to unsure of discharge but will call back to reschedule. Mother states she pumped 2 x colostrum containers and brought to NICU last night. Encouraged her to pump q 2.5-3 hours. No further questions at this time.      Maternal Data    Feeding Feeding Type: Donor Breast Milk Nipple Type: Nfant Slow Flow (purple)  LATCH Score                   Interventions    Lactation Tools Discussed/Used     Consult Status      Hardie Pulley 07/22/2018, 8:58 AM

## 2018-07-22 NOTE — Clinical Social Work Maternal (Signed)
CLINICAL SOCIAL WORK MATERNAL/CHILD NOTE  Patient Details  Name: Lindsay Keith MRN: [id number] Date of Birth: 03/26/1976  Date:  07/22/2018  Clinical Social Worker Initiating Note:  Hoyte Ziebell, LCSW     Date/Time: Initiated:  07/22/18/1054             Child's Name:  Lindsay Keith   Biological Parents:  Mother, Father(Lindsay Keith 01-21-97)   Need for Interpreter:  None   Reason for Referral:  Current Substance Use/Substance Use During Pregnancy , Parental Support of Premature Babies < 32 weeks/or Critically Ill babies   Address:  [address] [place] [address]    Phone number:  [phone] (home)     Additional phone number:   Household Members/Support Persons (HM/SP):   Household Member/Support Person 1, Household Member/Support Person 2   HM/SP Name Relationship DOB or Age  HM/SP -1 Jazmeen Smith daughter   HM/SP -2  MOB's mother    HM/SP -3     HM/SP -4     HM/SP -5     HM/SP -6     HM/SP -7     HM/SP -8       Natural Supports (not living in the home): Extended Family, Spouse/significant other   Professional Supports:None   Employment:Full-time   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   Education:  Some College   Homebound arranged:    Financial Resources:Medicaid   Other Resources: (Plans to apply for WIC )   Cultural/Religious Considerations Which May Impact Care:   Strengths: Ability to meet basic needs , Home prepared for child    Psychotropic Medications:         Pediatrician:       Pediatrician List:   [place]   [place]   [place]   [place]   [place]   [place]     Pediatrician Fax Number:    Risk Factors/Current Problems: Substance Use    Cognitive State: Able to Concentrate , Alert , Linear Thinking , Goal Oriented    Mood/Affect: Calm , Interested , Relaxed    CSW Assessment:CSW met with MOB at bedside to  discuss NICU admission and current substance use consult, FOB present and was asleep on couch. MOB granted CSW verbal permission to speak in front of FOB about anything. CSW introduced self and explained reason for consult. MOB reported that she currently resides with her mother and adult daughter. 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 MOB reported that she has almost everything that the baby needs except for a breast pump. MOB reported that she is working on getting on a breast pump but is excited that infant latched onto breast last night. CSW inquired about MOB's support system, MOB reported that FOB, her mother, daughter and sister were her supports. MOB reported that she is very close with her family and they are excited about infant.  CSW inquired about MOB's mental health history, MOB denied any mental health history and denied a history of postpartum depression with older daughter. MOB presented calm and remained engaged during assessment. MOB did not demonstrate any acute mental health signs/symptoms. CSW assessed for safety, MOB denied SI, HI and domestic violence.   CSW provided education regarding the baby blues period vs. perinatal mood disorders, discussed treatment and gave resources for mental health follow up if concerns arise.  CSW recommends self-evaluation during the postpartum time period using the New Mom Checklist   from Postpartum Progress and encouraged MOB to contact a medical professional if symptoms are noted at any time.    CSW informed MOB about hospital drug policy and inquired about MOB's substance use during pregnancy. MOB reported that she smoked marijuana and last use was 2 weeks before giving birth. MOB reported that she did not use marijuana daily only to assist with nausea. CSW informed MOB that infant's UDS was negative and CDS would continue to be monitored a CPS report would be made if warranted. MOB verbalized understanding and denied any  CPS history.   CSW and MOB discussed infant's NICU admission. MOB reported that her whole experience has been awesome and the OR staff was really nice during her C section. MOB reported that RN's in the NICU have been great and she feels well informed. CSW informed MOB about the NICU, what to expect and resources/supports available while infant is admitted to the NICU. MOB denied any needs/concerns at this time. MOB reported no transportation barriers to come and visit infant.    CSW will continue to offer support/resources while infant is admitted to the NICU.  CSW Plan/Description: Perinatal Mood and Anxiety Disorder (PMADs) Education, Hospital Drug Screen Policy Information, CSW Will Continue to Monitor Umbilical Cord Tissue Drug Screen Results and Make Report if Warranted    Shakeena Kafer L Ayyan Sites, LCSW 07/22/2018, 10:57 AM           

## 2018-07-22 NOTE — Discharge Instructions (Signed)

## 2018-07-23 LAB — CULTURE, BETA STREP (GROUP B ONLY)

## 2018-07-24 ENCOUNTER — Ambulatory Visit (HOSPITAL_COMMUNITY): Payer: Medicaid Other

## 2018-07-28 ENCOUNTER — Encounter: Payer: Medicaid Other | Admitting: Family Medicine

## 2018-08-03 ENCOUNTER — Encounter: Payer: Medicaid Other | Admitting: Family Medicine

## 2018-08-03 ENCOUNTER — Ambulatory Visit: Payer: Medicaid Other

## 2018-08-10 ENCOUNTER — Encounter: Payer: Medicaid Other | Admitting: Obstetrics & Gynecology

## 2018-08-17 ENCOUNTER — Telehealth: Payer: Self-pay | Admitting: Family Medicine

## 2018-08-17 NOTE — Telephone Encounter (Signed)
Called patient about her virtual visits. She stated her incision looked fine, and was ready to go back to work. Informed her to speak to clinical staff when they call her.

## 2018-08-20 ENCOUNTER — Other Ambulatory Visit: Payer: Self-pay

## 2018-08-20 ENCOUNTER — Ambulatory Visit (INDEPENDENT_AMBULATORY_CARE_PROVIDER_SITE_OTHER): Payer: Medicaid Other | Admitting: *Deleted

## 2018-08-20 ENCOUNTER — Ambulatory Visit: Payer: Medicaid Other | Admitting: Obstetrics & Gynecology

## 2018-08-20 VITALS — BP 156/86

## 2018-08-20 DIAGNOSIS — Z0130 Encounter for examination of blood pressure without abnormal findings: Secondary | ICD-10-CM

## 2018-08-20 NOTE — Progress Notes (Signed)
Called pt to check pt's BP.  Pt has a cuff at home and checked pt on phone call.  Pt's BP is 156/86.  Pt denies headaches, vision changes, upper right abdominal pain, edema.  Pt reports that she is taking her BP medication as prescribed but has not taken it today.  Reviewed with Dr. Debroah Loop who recommended that she continue checking her BP weekly and keep her postpartum appointment. S/s for pt to go to MAU and to contact the office reviewed with the pt.  Parameters for contacting the office regarding her BP reviewed with the pt.  Pt verbalized understanding.

## 2018-08-20 NOTE — Progress Notes (Signed)
Patient ID: Lindsay Keith, female   DOB: 1975/05/11, 43 y.o.   MRN: [id number] I have reviewed the chart and agree with nursing staff's documentation of this patient's encounter.  Scheryl Darter, MD 08/20/2018 5:10 PM

## 2018-08-24 ENCOUNTER — Ambulatory Visit: Payer: Medicaid Other | Admitting: Obstetrics and Gynecology

## 2018-08-25 ENCOUNTER — Telehealth (INDEPENDENT_AMBULATORY_CARE_PROVIDER_SITE_OTHER): Payer: Medicaid Other | Admitting: General Practice

## 2018-08-25 ENCOUNTER — Telehealth: Payer: Self-pay | Admitting: Student

## 2018-08-25 DIAGNOSIS — Z029 Encounter for administrative examinations, unspecified: Secondary | ICD-10-CM

## 2018-08-25 NOTE — Telephone Encounter (Signed)
Called patient regarding appt tomorrow and asked her to download cis[employer] webex meetings app for appt & explained process to her. Patient verbalized understanding & states she is feeding her baby right now but will do so after she is finished. Patient had no questions.

## 2018-08-25 NOTE — Telephone Encounter (Signed)
Called the patient to inform of the upcoming visit. The patient has the cisco webex app downloaded.

## 2018-08-26 ENCOUNTER — Other Ambulatory Visit: Payer: Self-pay

## 2018-08-26 ENCOUNTER — Ambulatory Visit: Payer: Medicaid Other | Admitting: Obstetrics and Gynecology

## 2018-08-26 ENCOUNTER — Ambulatory Visit (INDEPENDENT_AMBULATORY_CARE_PROVIDER_SITE_OTHER): Payer: Medicaid Other | Admitting: Obstetrics and Gynecology

## 2018-08-26 ENCOUNTER — Encounter: Payer: Self-pay | Admitting: Obstetrics and Gynecology

## 2018-08-26 DIAGNOSIS — Z3009 Encounter for other general counseling and advice on contraception: Secondary | ICD-10-CM | POA: Diagnosis not present

## 2018-08-26 DIAGNOSIS — O10919 Unspecified pre-existing hypertension complicating pregnancy, unspecified trimester: Secondary | ICD-10-CM

## 2018-08-26 DIAGNOSIS — Z8619 Personal history of other infectious and parasitic diseases: Secondary | ICD-10-CM | POA: Diagnosis not present

## 2018-08-26 DIAGNOSIS — Z1389 Encounter for screening for other disorder: Secondary | ICD-10-CM

## 2018-08-26 DIAGNOSIS — O0990 Supervision of high risk pregnancy, unspecified, unspecified trimester: Secondary | ICD-10-CM

## 2018-08-26 DIAGNOSIS — Z9889 Other specified postprocedural states: Secondary | ICD-10-CM

## 2018-08-26 NOTE — Progress Notes (Signed)
TELEHEALTH WEBEX POSTPARTUM VISIT ENCOUNTER NOTE  I connected with Lindsay Keith on 08/26/18 at  9:35 AM EDT via WebEx at home and verified that I am speaking with the correct person using two identifiers.   I discussed the limitations, risks, security and privacy concerns of performing an evaluation and management service by telephone and the availability of in person appointments. I also discussed with the patient that there may be a patient responsible charge related to this service. The patient expressed understanding and agreed to proceed.  Appointment Date: 08/26/2018  [facility]  Chief Complaint:  Chief Complaint  Patient presents with  . Postpartum Care    History of Present Illness: Lindsay Keith is a 43 y.o. African-American [phone] (Patient's last menstrual period was 10/15/2017.), seen for the above chief complaint. Her past medical history is significant for chronic hypertension, h/o HSV  She is s/p primary cesarean section on 07/18/18 at 33 weeks for non-reassuring fetal status; she was discharged to home on POD#4. Pregnancy complicated by gestational DM. Baby is doing well and at home.  Complains of mild incisional pain, denies fever, chills, nausea, vomiting. Reports incision is intact, non-red and non-oozing. States her partner looked at and "you can't even tell where they cut me." No other complaints. Eager to get back to work.  Vaginal bleeding or discharge: No  Mode of feeding infant: Bottle and Breast Intercourse: No  Contraception: no method PP depression s/s: No .  Any bowel or bladder issues: No  Pap smear: no abnormalities (date: 09/2016)  Review of Systems: Positive for n/a. Her 12 point review of systems is negative or as noted in the History of Present Illness.  Patient Active Problem List   Diagnosis Date Noted  . Gestational diabetes mellitus 06/18/2018  . AMA (advanced maternal age) multigravida 35+ 05/25/2018  . Rh  negative state in antepartum period 05/11/2018  . Chronic hypertension affecting pregnancy 05/11/2018  . BMI 40.0-44.9, adult (HCC) 05/11/2018  . Supervision of high risk pregnancy, antepartum 03/17/2018  . DEPRESSION, MAJOR 11/23/2007  . ESSENTIAL HYPERTENSION, BENIGN 11/23/2007  . History of herpes genitalis 09/08/2006  . Obesity in pregnancy 06/26/2006  . TOBACCO DEPENDENCE 06/26/2006  . HEMORRHOIDS, NOS 06/26/2006  . RHINITIS, ALLERGIC 06/26/2006  . ASTHMA, INTERMITTENT 06/26/2006  . GASTROESOPHAGEAL REFLUX, NO ESOPHAGITIS 06/26/2006    Medications Lindsay Keith had no medications administered during this visit. Current Outpatient Medications  Medication Sig Dispense Refill  . amLODipine (NORVASC) 10 MG tablet Take 1 tablet (10 mg total) by mouth daily. 30 tablet 1  . aspirin EC 81 MG tablet Take 1 tablet (81 mg total) by mouth daily. 60 tablet 2  . enalapril (VASOTEC) 5 MG tablet Take 1 tablet (5 mg total) by mouth daily. 30 tablet 1  . ibuprofen (ADVIL,MOTRIN) 600 MG tablet Take 1 tablet (600 mg total) by mouth every 6 (six) hours as needed for mild pain. 30 tablet 1  . Prenatal Vit-Fe Fumarate-FA (PRENATAL MULTIVITAMIN) TABS tablet Take 1 tablet by mouth daily at 12 noon.    Marland Kitchen. oxyCODONE-acetaminophen (PERCOCET/ROXICET) 5-325 MG tablet Take 1 tablet by mouth every 6 (six) hours as needed for severe pain. 24 tablet 0  . ranitidine (ZANTAC) 150 MG capsule Take 1 capsule (150 mg total) by mouth 2 (two) times daily. 60 capsule 2   No current facility-administered medications for this visit.     Allergies Patient has no known allergies.  Physical Exam:  LMP 10/15/2017   General:  Alert, oriented and cooperative. Patient is in no acute distress.  Mental Status: Normal mood and affect. Normal behavior. Normal judgment and thought content.   Respiratory: Normal respiratory effort noted, no problems with respiration noted  Rest of physical exam deferred due to type of  encounter  PP Depression Screening:   Edinburgh Postnatal Depression Scale - 08/26/18 0959      Edinburgh Postnatal Depression Scale:  In the Past 7 Days   I have been able to laugh and see the funny side of things.  0    I have looked forward with enjoyment to things.  0    I have blamed myself unnecessarily when things went wrong.  0    I have been anxious or worried for no good reason.  0    I have felt scared or panicky for no good reason.  0    Things have been getting on top of me.  0    I have been so unhappy that I have had difficulty sleeping.  0    I have felt sad or miserable.  0    I have been so unhappy that I have been crying.  0    The thought of harming myself has occurred to me.  0    Edinburgh Postnatal Depression Scale Total  0       Assessment:Patient is a 43 y.o. [id number] who is 4 weeks postpartum from a primary cesarean section.  She is doing well.   Plan: 1. Chronic hypertension affecting pregnancy Cont taking norvasc and enalapril Patient to take BP at home today and send [place] message with reading - she will f/u with family medicine for long term management  2. History of herpes genitalis No issues  3. Supervision of high risk pregnancy, antepartum Doing well  4. Encounter for other general counseling or advice on contraception Declines  5. Postoperative state Doing well, still having minor amount of pain  6. GDM - needs 2 hr pp GTT  RTC 6 months for annual  I discussed the assessment and treatment plan with the patient. The patient was provided an opportunity to ask questions and all were answered. The patient agreed with the plan and demonstrated an understanding of the instructions.   The patient was advised to call back or seek an in-person evaluation/go to the ED for any concerning postpartum symptoms.  I provided 15 minutes of face-to-face time during this encounter.   Conan Bowens, MD [facility] for [facility], [facility]

## 2018-09-04 ENCOUNTER — Other Ambulatory Visit: Payer: Medicaid Other

## 2018-09-04 ENCOUNTER — Telehealth: Payer: Self-pay | Admitting: Obstetrics and Gynecology

## 2018-09-04 ENCOUNTER — Other Ambulatory Visit: Payer: Self-pay | Admitting: *Deleted

## 2018-09-04 DIAGNOSIS — O24439 Gestational diabetes mellitus in the puerperium, unspecified control: Secondary | ICD-10-CM

## 2018-09-04 NOTE — Telephone Encounter (Signed)
The patient stated she overslept and would like to reschedule her appointment.

## 2018-09-10 ENCOUNTER — Other Ambulatory Visit: Payer: Medicaid Other

## 2018-09-16 ENCOUNTER — Other Ambulatory Visit: Payer: Self-pay

## 2018-09-16 ENCOUNTER — Other Ambulatory Visit: Payer: Medicaid Other

## 2018-09-16 DIAGNOSIS — O24439 Gestational diabetes mellitus in the puerperium, unspecified control: Secondary | ICD-10-CM

## 2018-09-17 LAB — GLUCOSE TOLERANCE, 2 HOURS
Glucose, 2 hour: 246 mg/dL — ABNORMAL HIGH (ref 65–139)
Glucose, GTT - Fasting: 130 mg/dL — ABNORMAL HIGH (ref 65–99)

## 2018-09-18 ENCOUNTER — Telehealth (INDEPENDENT_AMBULATORY_CARE_PROVIDER_SITE_OTHER): Payer: Medicaid Other

## 2018-09-18 DIAGNOSIS — R7309 Other abnormal glucose: Secondary | ICD-10-CM

## 2018-09-18 NOTE — Telephone Encounter (Addendum)
-----   Message from Conan Bowens, MD sent at 09/18/2018  1:41 AM EDT ----- Patient positive for DM on post partum 2 hr GTT, please ensure she has f/u with PCP, make referral if none  Notified pt that she failed her pp 2 hr GTT and that she needs to contact her PCP for management.  Pt states that her PCP is [facility].  I advised the pt to call their office to schedule an appt and that I would also place a referral in as well.  Pt stated understanding with no further questions.  Referral for PCP sent via Epic.

## 2018-09-23 ENCOUNTER — Telehealth: Payer: Self-pay | Admitting: Lactation Services

## 2018-09-23 NOTE — Telephone Encounter (Signed)
Pt called and left message on answering machine that she would like a release for return to work. Discussed with Marylynn Pearson on return to work recommendations.   Pt delivered by C/S on 07/17/18. She is eligible to return to work as of 09/21/18.  Return to work letter posted to pts my chart account.   Pt was called and informed that the letter is in her MyChart account.  Pt reports she has not reached out to there PCP in regards to her glucose testing, encouraged her to do so.

## 2018-12-09 ENCOUNTER — Encounter (HOSPITAL_COMMUNITY): Payer: Self-pay

## 2018-12-09 ENCOUNTER — Ambulatory Visit (HOSPITAL_COMMUNITY)
Admission: EM | Admit: 2018-12-09 | Discharge: 2018-12-09 | Disposition: A | Discharge status: Home / Self Care | Payer: Medicaid Other | Attending: Family Medicine | Admitting: Family Medicine

## 2018-12-09 ENCOUNTER — Other Ambulatory Visit: Payer: Self-pay

## 2018-12-09 DIAGNOSIS — J4520 Mild intermittent asthma, uncomplicated: Secondary | ICD-10-CM

## 2018-12-09 DIAGNOSIS — J301 Allergic rhinitis due to pollen: Secondary | ICD-10-CM

## 2018-12-09 MED ORDER — CETIRIZINE HCL 10 MG PO CAPS: 10.0000 mg | ORAL_CAPSULE | Freq: Every day | ORAL | 0 refills | Status: DC

## 2018-12-09 MED ORDER — FLUTICASONE PROPIONATE 50 MCG/ACT NA SUSP: 1.0000 | Freq: Every day | NASAL | 0 refills | Status: DC

## 2018-12-09 MED ORDER — PREDNISONE 50 MG PO TABS: 50.0000 mg | ORAL_TABLET | Freq: Every day | ORAL | 0 refills | Status: AC

## 2018-12-09 MED ORDER — ALBUTEROL SULFATE HFA 108 (90 BASE) MCG/ACT IN AERS: 1.0000 | INHALATION_SPRAY | Freq: Four times a day (QID) | RESPIRATORY_TRACT | 0 refills | Status: DC | PRN

## 2018-12-09 NOTE — Discharge Instructions (Addendum)
Happy early Rudene Anda!  Begin daily cetirizine to help with congestion and drainage Flonase nasal spray 1 to 2 sprays in each nostril daily for the next 1 to 2 weeks Prednisone daily for the next 5 days with food and in the morning if you are able Please use albuterol inhaler as needed  Please follow-up if symptoms not resolving or worsening

## 2018-12-09 NOTE — ED Triage Notes (Signed)
Pt states her allergies and asthma is flaring up. Pt states she was around a lot of pollen and her allergies flared up. Pt has had the Covid test and it was negative.

## 2018-12-10 NOTE — ED Provider Notes (Signed)
[facility]    CSN: [id number] Arrival date & time: 12/09/18  1720      History   Chief Complaint Chief Complaint  Patient presents with   Allergies    HPI Renae A Pennix-Walker is a 43 y.o. female history of hypertension, DM type II, tobacco use, asthma presenting today for evaluation of progression of her allergies and asthma.  Patient states that she is recently been tested for COVID and returned negative.  She states that she has had a lot of congestion, rhinorrhea, throat itching.  She is also had some shortness of breath and some mild wheezing.  She is currently working on cutting back on smoking, currently half pack per day.  She does not have an inhaler and has not used any medicines over-the-counter for allergies/congestion.  She states that she feels this is triggered by her work as she is often going from hot to cold frequently.  And she also notes a close contact recently brought a lot of pollen around as he works with trees.  Denies fevers chills body aches.  Otherwise feeling normal.  Symptoms began Monday.  HPI  Past Medical History:  Diagnosis Date   Asthma    Bartholin's gland abscess 02/27/2011   Depression    GERD (gastroesophageal reflux disease)    Gestational diabetes mellitus 06/18/2018   Hypertension     Patient Active Problem List   Diagnosis Date Noted   Gestational diabetes mellitus 06/18/2018   AMA (advanced maternal age) multigravida 35+ 05/25/2018   Rh negative state in antepartum period 05/11/2018   Chronic hypertension affecting pregnancy 05/11/2018   BMI 40.0-44.9, adult (HCC) 05/11/2018   Supervision of high risk pregnancy, antepartum 03/17/2018   DEPRESSION, MAJOR 11/23/2007   ESSENTIAL HYPERTENSION, BENIGN 11/23/2007   History of herpes genitalis 09/08/2006   Obesity in pregnancy 06/26/2006   TOBACCO DEPENDENCE 06/26/2006   HEMORRHOIDS, NOS 06/26/2006   RHINITIS, ALLERGIC 06/26/2006   ASTHMA,  INTERMITTENT 06/26/2006   GASTROESOPHAGEAL REFLUX, NO ESOPHAGITIS 06/26/2006    Past Surgical History:  Procedure Laterality Date   CESAREAN SECTION N/A 07/18/2018   Procedure: CESAREAN SECTION;  Surgeon: Tilda BurrowFerguson, John V, MD;  Location: [facility];  Service: Obstetrics;  Laterality: N/A;   WISDOM TOOTH EXTRACTION      OB History    Gravida  3   Para  2   Term  1   Preterm  1   AB  1   Living  2     SAB  1   TAB  0   Ectopic  0   Multiple  0   Live Births  2            Home Medications    Prior to Admission medications   Medication Sig Start Date End Date Taking? Authorizing Provider  albuterol (VENTOLIN HFA) 108 (90 Base) MCG/ACT inhaler Inhale 1-2 puffs into the lungs every 6 (six) hours as needed for wheezing or shortness of breath. 12/09/18   Jakeel Starliper C, PA-C  amLODipine (NORVASC) 10 MG tablet Take 1 tablet (10 mg total) by mouth daily. 07/23/18   Hermina StaggersErvin, Michael L, MD  aspirin EC 81 MG tablet Take 1 tablet (81 mg total) by mouth daily. 05/11/18   Sunizona BingPickens, Charlie, MD  Cetirizine HCl 10 MG CAPS Take 1 capsule (10 mg total) by mouth daily. 12/09/18   Bernadine Melecio C, PA-C  enalapril (VASOTEC) 5 MG tablet Take 1 tablet (5 mg total) by mouth daily. 07/23/18  Chancy Milroy, MD  fluticasone (FLONASE) 50 MCG/ACT nasal spray Place 1-2 sprays into both nostrils daily for 7 days. 12/09/18 12/16/18  Jahniya Duzan C, PA-C  ibuprofen (ADVIL,MOTRIN) 600 MG tablet Take 1 tablet (600 mg total) by mouth every 6 (six) hours as needed for mild pain. 07/22/18   Chancy Milroy, MD  oxyCODONE-acetaminophen (PERCOCET/ROXICET) 5-325 MG tablet Take 1 tablet by mouth every 6 (six) hours as needed for severe pain. 07/22/18   Chancy Milroy, MD  predniSONE (DELTASONE) 50 MG tablet Take 1 tablet (50 mg total) by mouth daily for 5 days. 12/09/18 12/14/18  Mariaguadalupe Fialkowski C, PA-C  Prenatal Vit-Fe Fumarate-FA (PRENATAL MULTIVITAMIN) TABS tablet Take 1 tablet by mouth daily at 12  noon.    [provider]  ranitidine (ZANTAC) 150 MG capsule Take 1 capsule (150 mg total) by mouth 2 (two) times daily. 05/11/18   Aletha Halim, MD    Family History History reviewed. No pertinent family history.  Social History Social History   Tobacco Use   Smoking status: Current Every Day Smoker    Packs/day: 0.25   Smokeless tobacco: Never Used  Substance Use Topics   Alcohol use: Not Currently   Drug use: Yes    Types: Marijuana    Comment: Last marijuana Feb 2020     Allergies   Patient has no known allergies.   Review of Systems Review of Systems  Constitutional: Negative for activity change, appetite change, chills, fatigue and fever.  HENT: Positive for congestion and rhinorrhea. Negative for ear pain, sinus pressure, sore throat and trouble swallowing.   Eyes: Negative for discharge and redness.  Respiratory: Positive for shortness of breath. Negative for cough and chest tightness.   Cardiovascular: Negative for chest pain.  Gastrointestinal: Negative for abdominal pain, diarrhea, nausea and vomiting.  Musculoskeletal: Negative for myalgias.  Skin: Negative for rash.  Neurological: Negative for dizziness, light-headedness and headaches.     Physical Exam Triage Vital Signs ED Triage Vitals  Enc Vitals Group     BP 12/09/18 1747 136/88     Pulse Rate 12/09/18 1747 86     Resp 12/09/18 1747 18     Temp 12/09/18 1747 98.5 F (36.9 C)     Temp Source 12/09/18 1747 Oral     SpO2 12/09/18 1747 100 %     Weight 12/09/18 1744 210 lb (95.3 kg)     Height --      Head Circumference --      Peak Flow --      Pain Score 12/09/18 1744 5     Pain Loc --      Pain Edu? --      Excl. in [place]? --    No data found.  Updated Vital Signs BP 136/88 (BP Location: Right Arm)    Pulse 86    Temp 98.5 F (36.9 C) (Oral)    Resp 18    Wt 210 lb (95.3 kg)    LMP 11/02/2018    SpO2 100%    BMI 39.68 kg/m   Visual Acuity Right Eye Distance:   Left Eye  Distance:   Bilateral Distance:    Right Eye Near:   Left Eye Near:    Bilateral Near:     Physical Exam Vitals signs and nursing note reviewed.  Constitutional:      General: She is not in acute distress.    Appearance: She is well-developed.  HENT:     Head:  Normocephalic and atraumatic.     Ears:     Comments: Bilateral ears without tenderness to palpation of external auricle, tragus and mastoid, EAC's without erythema or swelling, TM's with good bony landmarks and cone of light. Non erythematous.     Nose:     Comments: Nasal mucosa erythematous, swollen turbinates, mild amount of rhinorrhea present  Sounds congested    Mouth/Throat:     Comments: Oral mucosa pink and moist, no tonsillar enlargement or exudate. Posterior pharynx patent and nonerythematous, no uvula deviation or swelling. Normal phonation.  Eyes:     Conjunctiva/sclera: Conjunctivae normal.  Neck:     Musculoskeletal: Neck supple.  Cardiovascular:     Rate and Rhythm: Normal rate and regular rhythm.     Heart sounds: No murmur.  Pulmonary:     Effort: Pulmonary effort is normal. No respiratory distress.     Breath sounds: Normal breath sounds.     Comments: Breathing comfortably at rest, CTABL, no wheezing, rales or other adventitious sounds auscultated  Abdominal:     Palpations: Abdomen is soft.     Tenderness: There is no abdominal tenderness.  Skin:    General: Skin is warm and dry.  Neurological:     Mental Status: She is alert.      UC Treatments / Results  Labs (all labs ordered are listed, but only abnormal results are displayed) Labs Reviewed - No data to display  EKG   Radiology No results found.  Procedures Procedures (including critical care time)  Medications Ordered in UC Medications - No data to display  Initial Impression / Assessment and Plan / UC Course  I have reviewed the triage vital signs and the nursing notes.  Pertinent labs & imaging results that were  available during my care of the patient were reviewed by me and considered in my medical decision making (see chart for details).     Patient with allergic rhinitis versus viral URI.  Recent negative COVID test.  Without fever.  Will initiate on Zyrtec and Flonase for congestion.  Short course of prednisone to calm down inflammation in sinuses and lungs.  Clear to auscultation currently, and without respiratory distress.  Albuterol inhaler filled.  Continue to monitor,Discussed strict return precautions. Patient verbalized understanding and is agreeable with plan.  Final Clinical Impressions(s) / UC Diagnoses   Final diagnoses:  Allergic rhinitis due to pollen, unspecified seasonality  Mild intermittent asthma without complication     Discharge Instructions     Happy early [place] OuchBirthday!  Begin daily cetirizine to help with congestion and drainage Flonase nasal spray 1 to 2 sprays in each nostril daily for the next 1 to 2 weeks Prednisone daily for the next 5 days with food and in the morning if you are able Please use albuterol inhaler as needed  Please follow-up if symptoms not resolving or worsening   ED Prescriptions    Medication Sig Dispense Auth. Provider   Cetirizine HCl 10 MG CAPS Take 1 capsule (10 mg total) by mouth daily. 30 capsule Kiley Torrence C, PA-C   fluticasone (FLONASE) 50 MCG/ACT nasal spray Place 1-2 sprays into both nostrils daily for 7 days. 1 g Leomia Blake C, PA-C   albuterol (VENTOLIN HFA) 108 (90 Base) MCG/ACT inhaler Inhale 1-2 puffs into the lungs every 6 (six) hours as needed for wheezing or shortness of breath. 8 g Cintya Daughety C, PA-C   predniSONE (DELTASONE) 50 MG tablet Take 1 tablet (50 mg total) by mouth  daily for 5 days. 5 tablet Manning Luna C, PA-C     Controlled Substance Prescriptions [place] Controlled Substance Registry consulted? Not Applicable   Lew DawesWieters, Pepper Kerrick C, [place]PA-C 12/10/18 [phone]

## 2018-12-14 ENCOUNTER — Encounter (HOSPITAL_COMMUNITY): Payer: Self-pay | Admitting: Emergency Medicine

## 2018-12-14 ENCOUNTER — Ambulatory Visit (HOSPITAL_COMMUNITY)
Admission: EM | Admit: 2018-12-14 | Discharge: 2018-12-14 | Disposition: A | Discharge status: Home / Self Care | Payer: Medicaid Other | Attending: Family Medicine | Admitting: Family Medicine

## 2018-12-14 ENCOUNTER — Other Ambulatory Visit: Payer: Self-pay

## 2018-12-14 DIAGNOSIS — J040 Acute laryngitis: Secondary | ICD-10-CM

## 2018-12-14 NOTE — Discharge Instructions (Signed)
When you lose your voice it is called laryngitis.  Laryngitis is almost always caused by a virus.  Warm tea.  Salt water gargles with warm water.  Drinking lots of fluids.  All these things are helpful The most important thing is to stop talking.  If you do not use your voice, it will get better faster

## 2018-12-14 NOTE — ED Triage Notes (Signed)
Pt presents to [facility] for assessment of loss of voice, after being seen last week for allergies.  Patient states she began to lose her voice Friday, and worsening over the weekend.

## 2018-12-14 NOTE — ED Provider Notes (Signed)
[facility]    CSN: [id number] Arrival date & time: 12/14/18  1542      History   Chief Complaint Chief Complaint  Patient presents with  . Hoarse    HPI Carliss A Pennix-Walker is a 43 y.o. female.   HPI  Patient was seen here 3 days ago.  She was diagnosed with environmental allergies.  She was given prednisone, Flonase, and Zyrtec.  She states that all of her symptoms of gotten better.  Nasal congestion is improved.  No postnasal drip.  No sinus pressure or pain.  No sore throat.  Unfortunately, over the last 2 days she has "lost her voice".  She is now suffering from laryngitis.  She otherwise feels well.  She works in a nursing home.  She will be able to go back to work with her voice and..  She has had coronavirus testing.  It was negative.  She is having no body aches, muscle aches, fatigue, fever chills, shortness of breath.  Past Medical History:  Diagnosis Date  . Asthma   . Bartholin's gland abscess 02/27/2011  . Depression   . GERD (gastroesophageal reflux disease)   . Gestational diabetes mellitus 06/18/2018  . Hypertension     Patient Active Problem List   Diagnosis Date Noted  . Gestational diabetes mellitus 06/18/2018  . AMA (advanced maternal age) multigravida 35+ 05/25/2018  . Rh negative state in antepartum period 05/11/2018  . Chronic hypertension affecting pregnancy 05/11/2018  . BMI 40.0-44.9, adult (HCC) 05/11/2018  . Supervision of high risk pregnancy, antepartum 03/17/2018  . DEPRESSION, MAJOR 11/23/2007  . ESSENTIAL HYPERTENSION, BENIGN 11/23/2007  . History of herpes genitalis 09/08/2006  . Obesity in pregnancy 06/26/2006  . TOBACCO DEPENDENCE 06/26/2006  . HEMORRHOIDS, NOS 06/26/2006  . RHINITIS, ALLERGIC 06/26/2006  . ASTHMA, INTERMITTENT 06/26/2006  . GASTROESOPHAGEAL REFLUX, NO ESOPHAGITIS 06/26/2006    Past Surgical History:  Procedure Laterality Date  . CESAREAN SECTION N/A 07/18/2018   Procedure: CESAREAN SECTION;   Surgeon: Tilda BurrowFerguson, John V, MD;  Location: [facility];  Service: Obstetrics;  Laterality: N/A;  . WISDOM TOOTH EXTRACTION      OB History    Gravida  3   Para  2   Term  1   Preterm  1   AB  1   Living  2     SAB  1   TAB  0   Ectopic  0   Multiple  0   Live Births  2            Home Medications    Prior to Admission medications   Medication Sig Start Date End Date Taking? Authorizing Provider  albuterol (VENTOLIN HFA) 108 (90 Base) MCG/ACT inhaler Inhale 1-2 puffs into the lungs every 6 (six) hours as needed for wheezing or shortness of breath. 12/09/18   Wieters, Hallie C, PA-C  amLODipine (NORVASC) 10 MG tablet Take 1 tablet (10 mg total) by mouth daily. 07/23/18   Hermina StaggersErvin, Michael L, MD  aspirin EC 81 MG tablet Take 1 tablet (81 mg total) by mouth daily. 05/11/18   [place] BingPickens, Charlie, MD  Cetirizine HCl 10 MG CAPS Take 1 capsule (10 mg total) by mouth daily. 12/09/18   Wieters, Hallie C, PA-C  enalapril (VASOTEC) 5 MG tablet Take 1 tablet (5 mg total) by mouth daily. 07/23/18   Hermina StaggersErvin, Michael L, MD  fluticasone (FLONASE) 50 MCG/ACT nasal spray Place 1-2 sprays into both nostrils daily for 7 days. 12/09/18  12/16/18  Wieters, Hallie C, PA-C  ibuprofen (ADVIL,MOTRIN) 600 MG tablet Take 1 tablet (600 mg total) by mouth every 6 (six) hours as needed for mild pain. 07/22/18   Hermina StaggersErvin, Michael L, MD  oxyCODONE-acetaminophen (PERCOCET/ROXICET) 5-325 MG tablet Take 1 tablet by mouth every 6 (six) hours as needed for severe pain. 07/22/18   Hermina StaggersErvin, Michael L, MD  predniSONE (DELTASONE) 50 MG tablet Take 1 tablet (50 mg total) by mouth daily for 5 days. 12/09/18 12/14/18  Wieters, Hallie C, PA-C  Prenatal Vit-Fe Fumarate-FA (PRENATAL MULTIVITAMIN) TABS tablet Take 1 tablet by mouth daily at 12 noon.    [provider]  ranitidine (ZANTAC) 150 MG capsule Take 1 capsule (150 mg total) by mouth 2 (two) times daily. 05/11/18   [place] BingPickens, Charlie, MD    Family History History reviewed. No  pertinent family history.  Social History Social History   Tobacco Use  . Smoking status: Current Every Day Smoker    Packs/day: 0.25  . Smokeless tobacco: Never Used  Substance Use Topics  . Alcohol use: Not Currently  . Drug use: Yes    Types: Marijuana    Comment: Last marijuana Feb 2020     Allergies   Patient has no known allergies.   Review of Systems Review of Systems  Constitutional: Negative for chills and fever.  HENT: Positive for voice change. Negative for ear pain and sore throat.   Eyes: Negative for pain and visual disturbance.  Respiratory: Negative for cough and shortness of breath.   Cardiovascular: Negative for chest pain and palpitations.  Gastrointestinal: Negative for abdominal pain and vomiting.  Genitourinary: Negative for dysuria and hematuria.  Musculoskeletal: Negative for arthralgias and back pain.  Skin: Negative for color change and rash.  Neurological: Negative for seizures and syncope.  All other systems reviewed and are negative.    Physical Exam Triage Vital Signs ED Triage Vitals  Enc Vitals Group     BP 12/14/18 1718 (!) 162/112     Pulse Rate 12/14/18 1718 (!) 109     Resp 12/14/18 1718 18     Temp 12/14/18 1718 98.8 F (37.1 C)     Temp Source 12/14/18 1718 Oral     SpO2 12/14/18 1718 95 %     Weight --      Height --      Head Circumference --      Peak Flow --      Pain Score 12/14/18 1717 0     Pain Loc --      Pain Edu? --      Excl. in GC? --    No data found.  Updated Vital Signs BP (!) 162/112 (BP Location: Right Wrist) Comment: Didn't take BP med today  Pulse (!) 109   Temp 98.8 F (37.1 C) (Oral)   Resp 18   SpO2 95%   Visual Acuity Right Eye Distance:   Left Eye Distance:   Bilateral Distance:    Right Eye Near:   Left Eye Near:    Bilateral Near:     Physical Exam Constitutional:      General: She is not in acute distress.    Appearance: She is well-developed.  HENT:     Head:  Normocephalic and atraumatic.     Right Ear: Tympanic membrane, ear canal and external ear normal.     Left Ear: Tympanic membrane, ear canal and external ear normal.     Nose:     Comments:  Nose is clear.  No sinus tenderness    Mouth/Throat:     Mouth: Mucous membranes are moist.     Comments: Large tonsils.  Not inflamed Eyes:     Conjunctiva/sclera: Conjunctivae normal.     Pupils: Pupils are equal, round, and reactive to light.  Neck:     Musculoskeletal: Normal range of motion.  Cardiovascular:     Rate and Rhythm: Normal rate and regular rhythm.     Heart sounds: Normal heart sounds.  Pulmonary:     Effort: Pulmonary effort is normal. No respiratory distress.     Breath sounds: Normal breath sounds.  Abdominal:     General: There is no distension.     Palpations: Abdomen is soft.  Musculoskeletal: Normal range of motion.  Lymphadenopathy:     Cervical: No cervical adenopathy.  Skin:    General: Skin is warm and dry.  Neurological:     Mental Status: She is alert.      UC Treatments / Results  Labs (all labs ordered are listed, but only abnormal results are displayed) Labs Reviewed - No data to display  EKG   Radiology No results found.  Procedures Procedures (including critical care time)  Medications Ordered in UC Medications - No data to display  Initial Impression / Assessment and Plan / UC Course  I have reviewed the triage vital signs and the nursing notes.  Pertinent labs & imaging results that were available during my care of the patient were reviewed by me and considered in my medical decision making (see chart for details).     I told the patient she likely has a viral laryngitis.  Most common cause of laryngitis is a virus.  Should go away in a few days.  Instructions are given. Final Clinical Impressions(s) / UC Diagnoses   Final diagnoses:  Laryngitis, acute     Discharge Instructions     When you lose your voice it is called  laryngitis.  Laryngitis is almost always caused by a virus.  Warm tea.  Salt water gargles with warm water.  Drinking lots of fluids.  All these things are helpful The most important thing is to stop talking.  If you do not use your voice, it will get better faster   ED Prescriptions    None     Controlled Substance Prescriptions [place] Controlled Substance Registry consulted? Not Applicable   Raylene Everts, MD 12/14/18 1815

## 2019-03-17 ENCOUNTER — Ambulatory Visit: Payer: Medicaid Other | Admitting: Obstetrics and Gynecology

## 2019-04-03 IMAGING — US US MFM OB FOLLOW-UP
1 series · 13 of 28 positions shown · non-contrast
Comparison: none

[Series 1: us mfm ob follow-up · 13 of 31 slices shown]
[im 2/31]
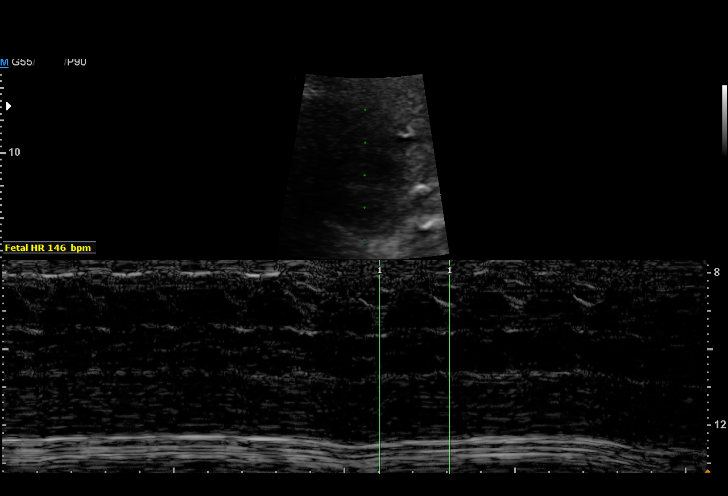
[im 4/31]
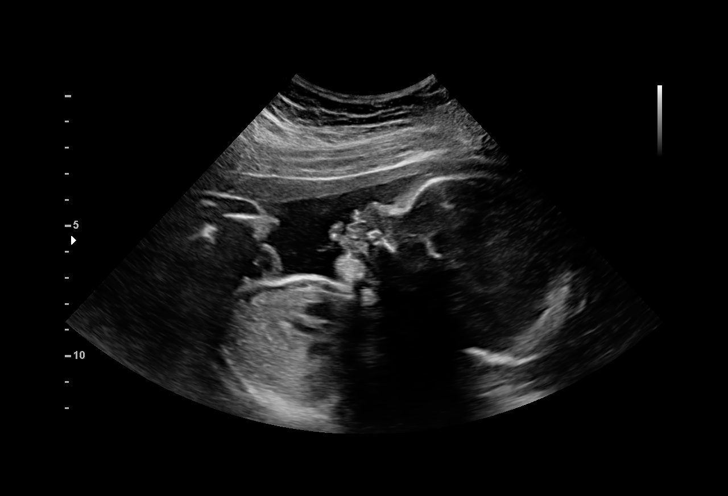
[im 6/31]
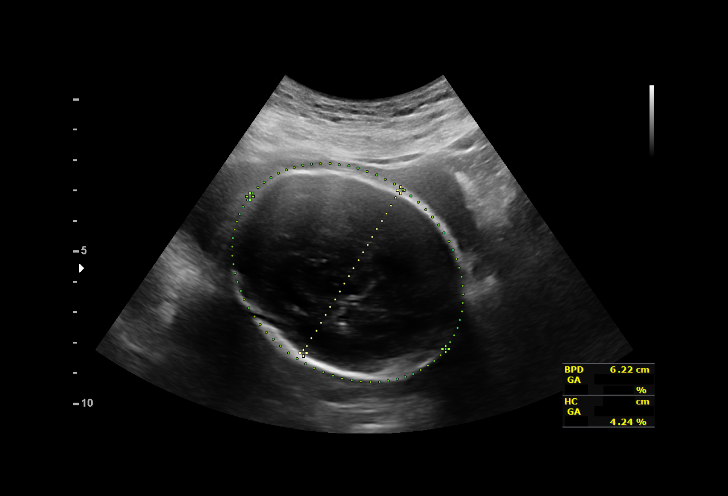
[im 8/31]
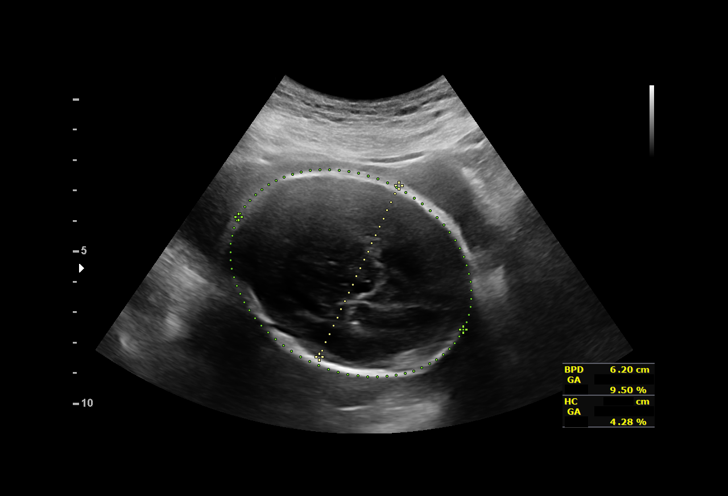
[im 11/31]
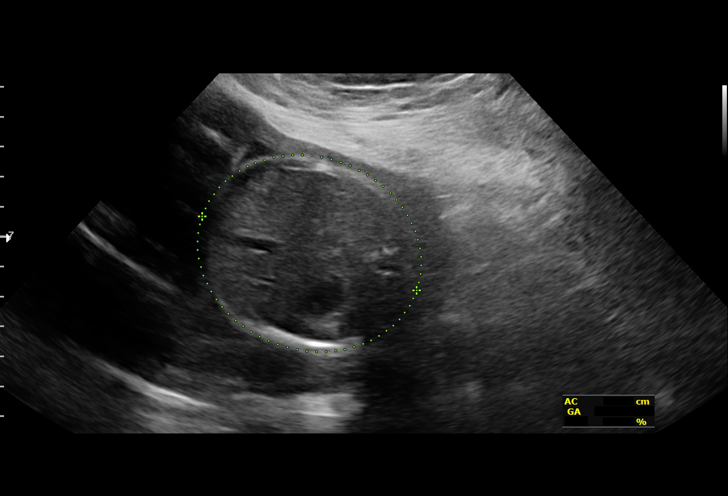
[im 13/31]
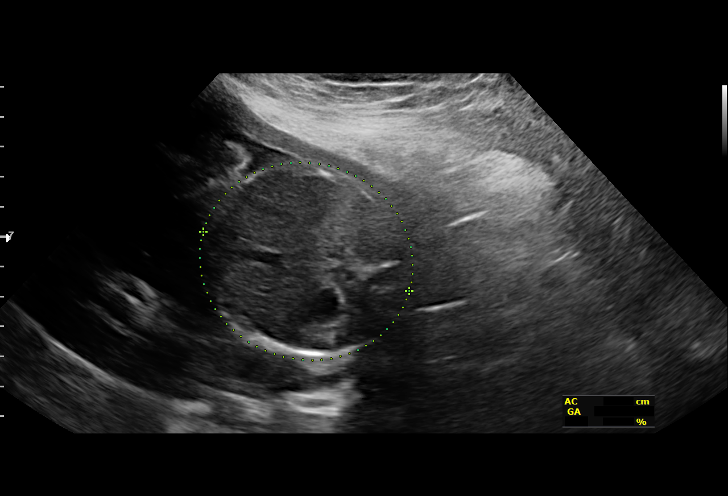
[im 16/31]
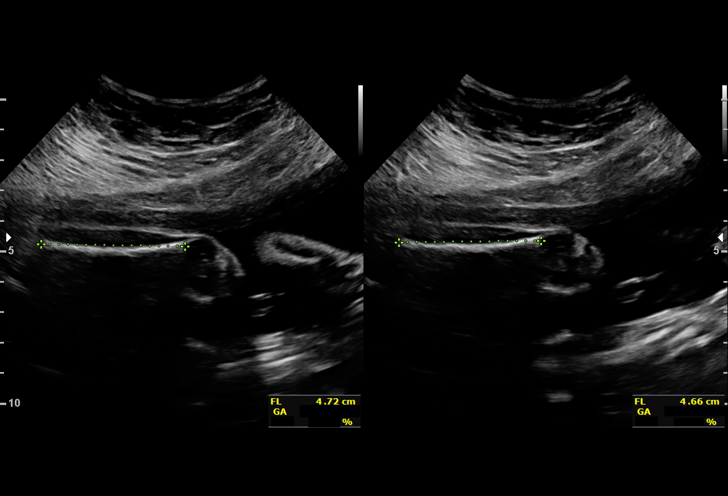
[im 18/31]
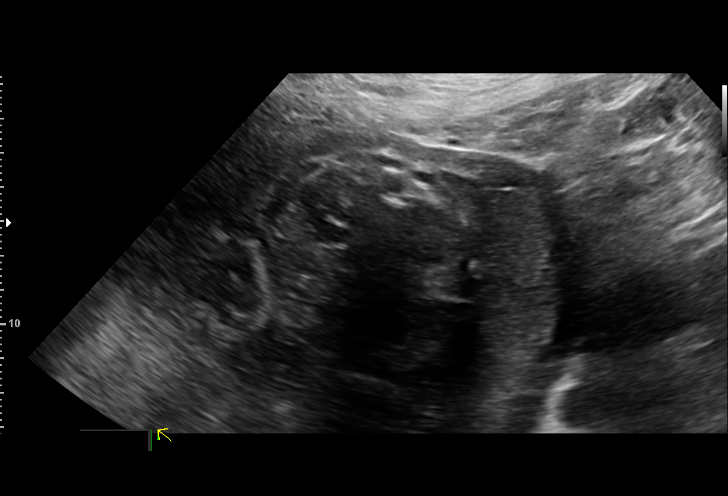
[im 21/31]
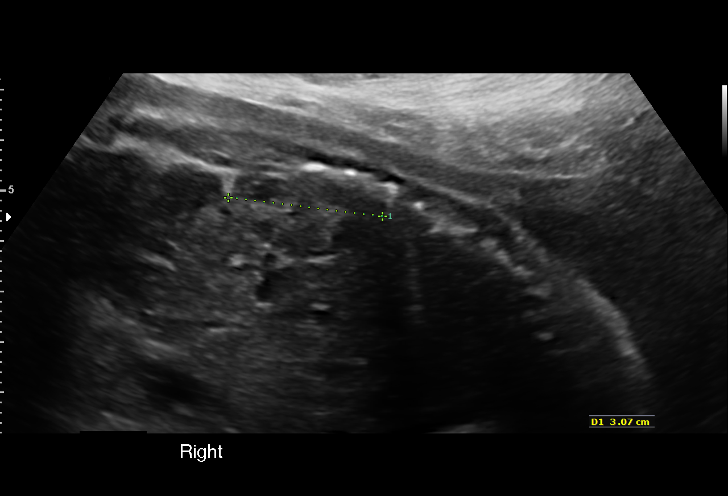
[im 23/31]
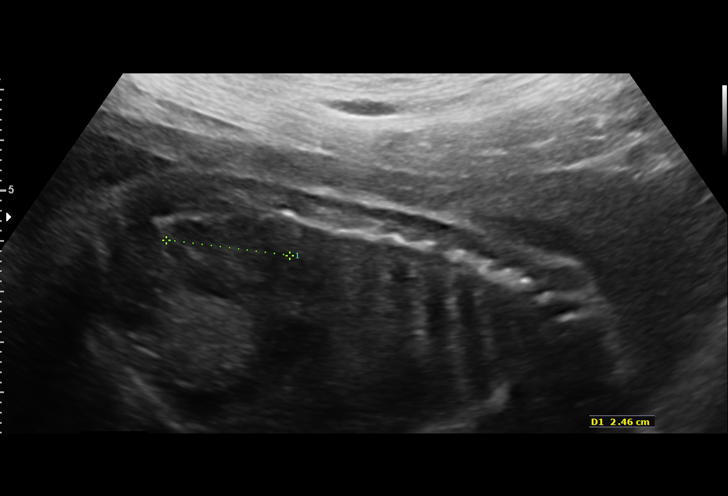
[im 25/31]
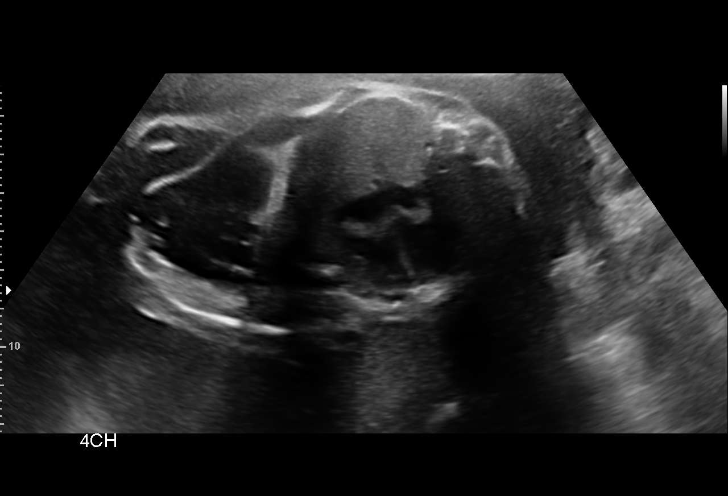
[im 27/31]
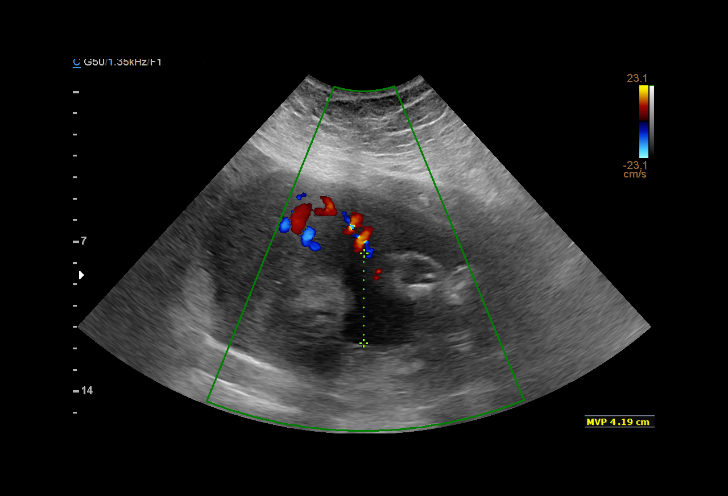
[im 29/31]
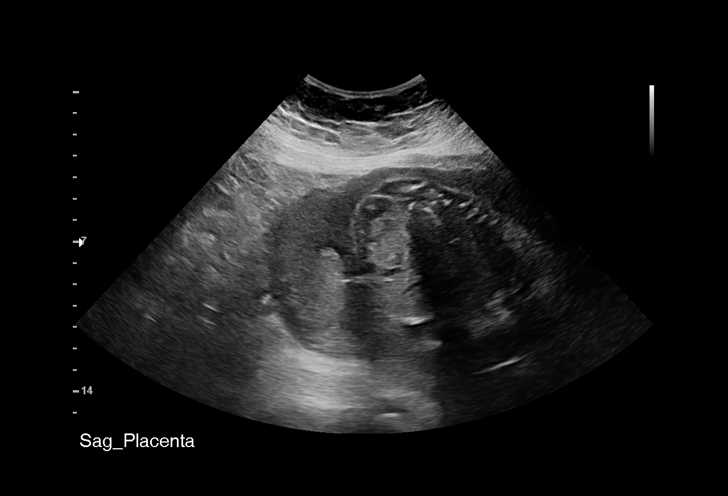

[13 of 28 positions shown; findings below may reference images not displayed]

JOUMA

                                                       JIM
 ----------------------------------------------------------------------

 ----------------------------------------------------------------------
Indications

  Encounter for other antenatal screening
  follow-up
  26 weeks gestation of pregnancy
  Advanced maternal age multigravida 42,
  second trimester
  Obesity complicating pregnancy, second
  trimester
  Hypertension - Chronic/Pre-existing
  (labetalol)
 ----------------------------------------------------------------------
Vital Signs

 BMI:         45.15        Pulse:  102
 BP:          150/99
Fetal Evaluation

 Num Of Fetuses:         1
 Fetal Heart Rate(bpm):  146
 Cardiac Activity:       Observed
 Presentation:           Cephalic
 Placenta:               Posterior
 P. Cord Insertion:      Previously Visualized

 Amniotic Fluid
 AFI FV:      Within normal limits

                             Largest Pocket(cm)

Biometry
 BPD:      61.9  mm     G. Age:  25w 1d          9  %    CI:        72.03   %    70 - 86
                                                         FL/HC:      20.2   %    18.6 -
 HC:      232.1  mm     G. Age:  25w 1d          5  %    HC/AC:      1.07        1.04 -
 AC:      217.7  mm     G. Age:  26w 2d         40  %    FL/BPD:     75.8   %    71 - 87
 FL:       46.9  mm     G. Age:  25w 5d         19  %    FL/AC:      21.5   %    20 - 24
 LV:        3.1  mm

 Est. FW:     862  gm    1 lb 14 oz      45  %
OB History

 Gravidity:    3         Term:   1        Prem:   0        SAB:   1
 TOP:          0       Ectopic:  0        Living: 1
Gestational Age

 LMP:           32w 1d        Date:  10/15/17                 EDD:   07/22/18
 U/S Today:     25w 4d                                        EDD:   09/06/18
 Best:          26w 2d     Det. By:  U/S  (04/30/18)          EDD:   09/01/18
Anatomy

 Cranium:               Appears normal         LVOT:                   Previously seen
 Cavum:                 Appears normal         Aortic Arch:            Previously seen
 Ventricles:            Appears normal         Ductal Arch:            Previously seen
 Choroid Plexus:        Previously seen        Diaphragm:              Previously seen
 Cerebellum:            Previously seen        Stomach:                Appears normal, left
                                                                       sided
 Posterior Fossa:       Previously seen        Abdomen:                Previously seen
 Nuchal Fold:           Not applicable (>20    Abdominal Wall:         Previously seen
                        wks GA)
 Face:                  Orbits and profile     Cord Vessels:           Previously seen
                        previously seen
 Lips:                  Appears normal         Kidneys:                Appear normal
 Palate:                Previously seen        Bladder:                Appears normal
 Thoracic:              Appears normal         Spine:                  Previously seen
 Heart:                 Appears normal         Upper Extremities:      Previously seen
                        (4CH, axis, and
                        situs)
 RVOT:                  Previously seen        Lower Extremities:      Previously seen

 Other:  Left 5th digit prev visualized. Nasal bone visualized.
Cervix Uterus Adnexa

 Cervix
 Not visualized (advanced GA >63wks)
Impression

 Patient returned for completion of fetal anatomy and growth
 assessment. The CSP is seen well. Amniotic fluid is normal
 and good fetal activity is seen. Fetal growth is appropriate for
 gestational age.

 Patient has chronic hypertension and reports that her
 labetalol dosage was increased on [REDACTED]. Her blood
 pressure is 150/99 mm Hg. She does not have symptoms of
 severe headache or visual disturbances or right upper
 quadrant pain or vaginal bleeding.
Recommendations

 -An appointment was made for her to return in 4 weeks for
 fetal growth assessment.
 -Weekly antenatal testing from 32 weeks till delivery.

                 Rademacher, Sravani

## 2019-05-04 ENCOUNTER — Ambulatory Visit: Payer: Medicaid Other | Admitting: Obstetrics and Gynecology

## 2019-05-05 IMAGING — US US MFM FETAL BPP W/O NON-STRESS
1 series · 13 of 28 positions shown · non-contrast
Comparison: none

[Series 1: us mfm fetal bpp w/o non-stress · 34 acquisitions, 13 frames shown]
[im 2/34]
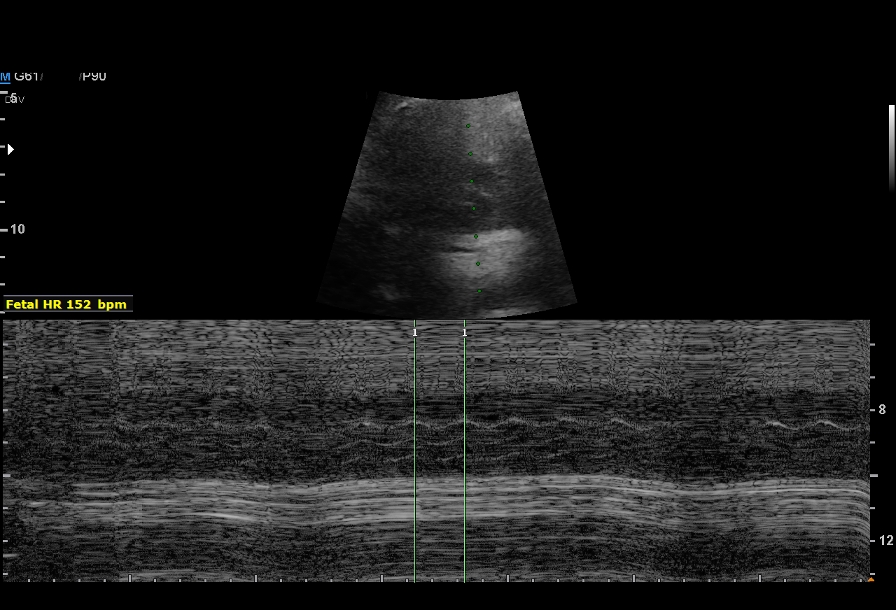
[im 4/34]
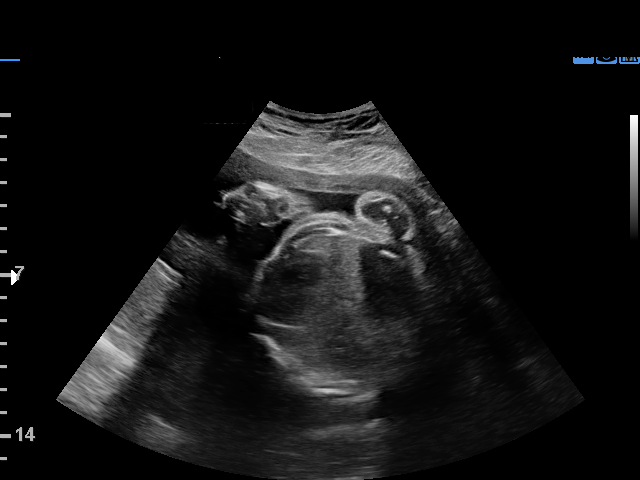
[im 7/34]
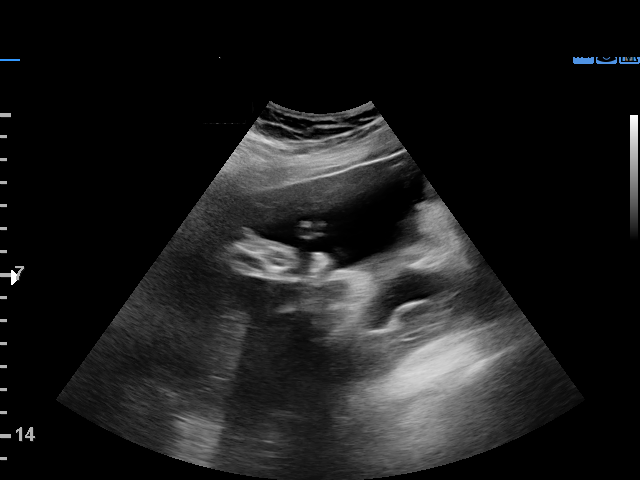
[im 9/34]
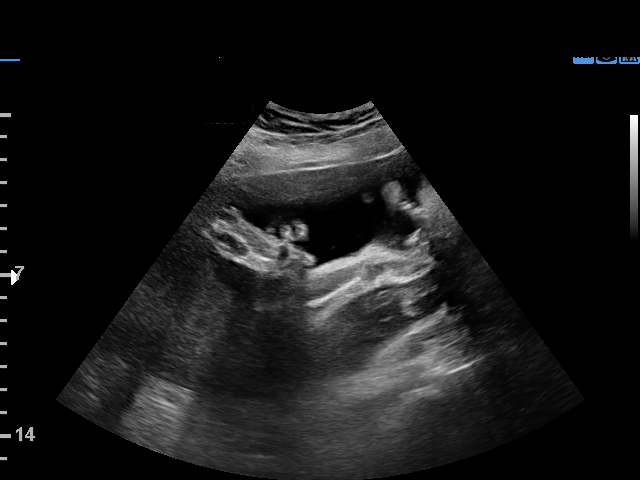
[im 12/34]
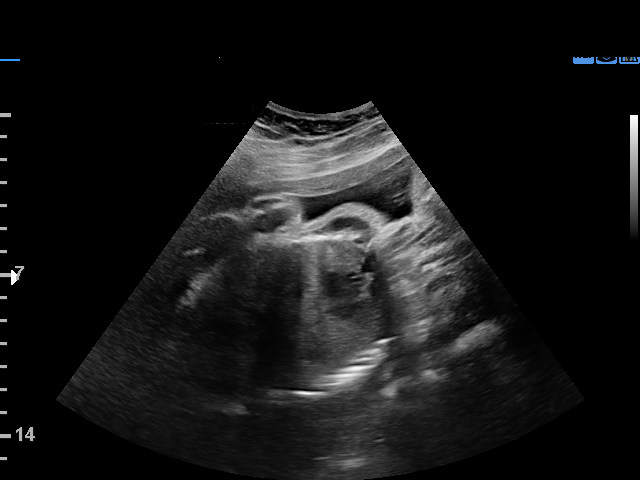
[im 14/34]
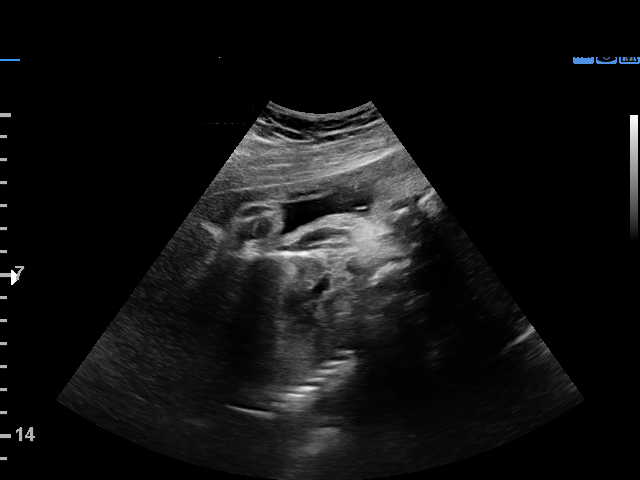
[im 18/34]
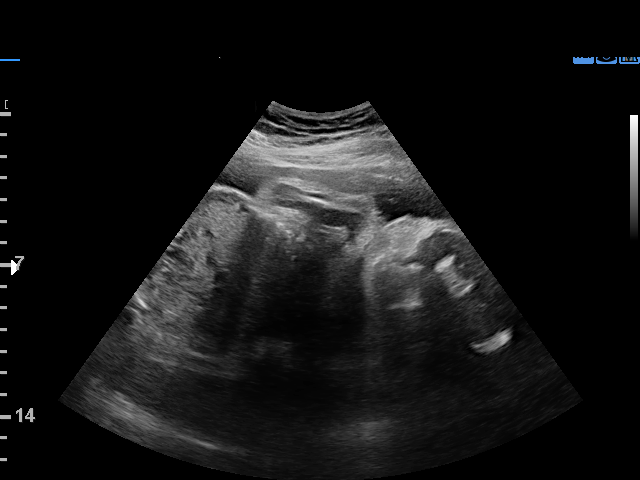
[im 20/34]
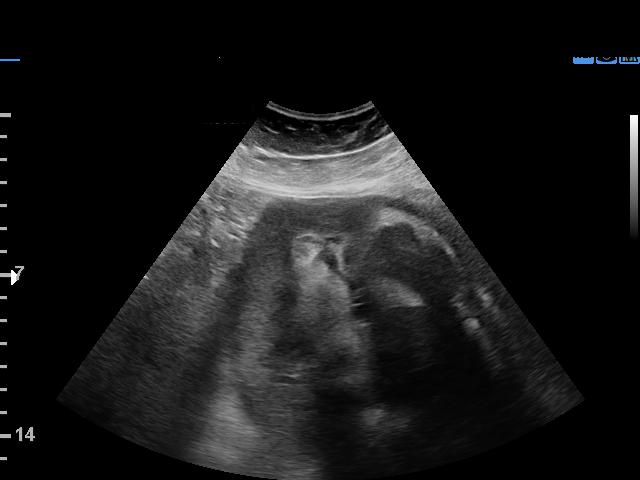
[im 23/34]
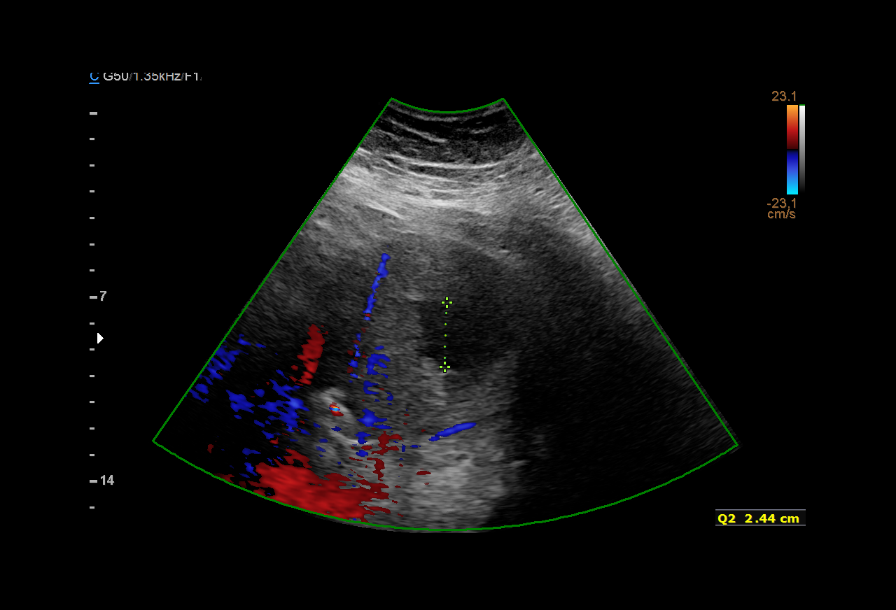
[im 25/34]
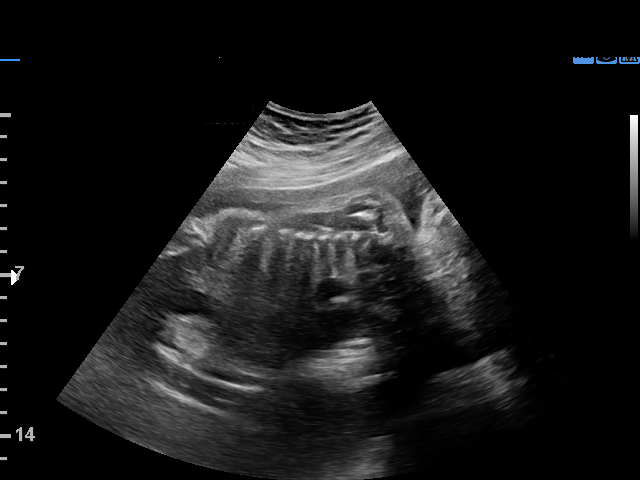
[im 27/34]
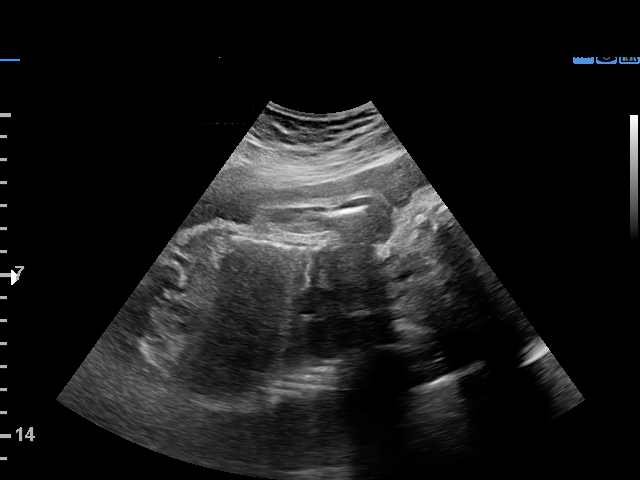
[im 30/34]
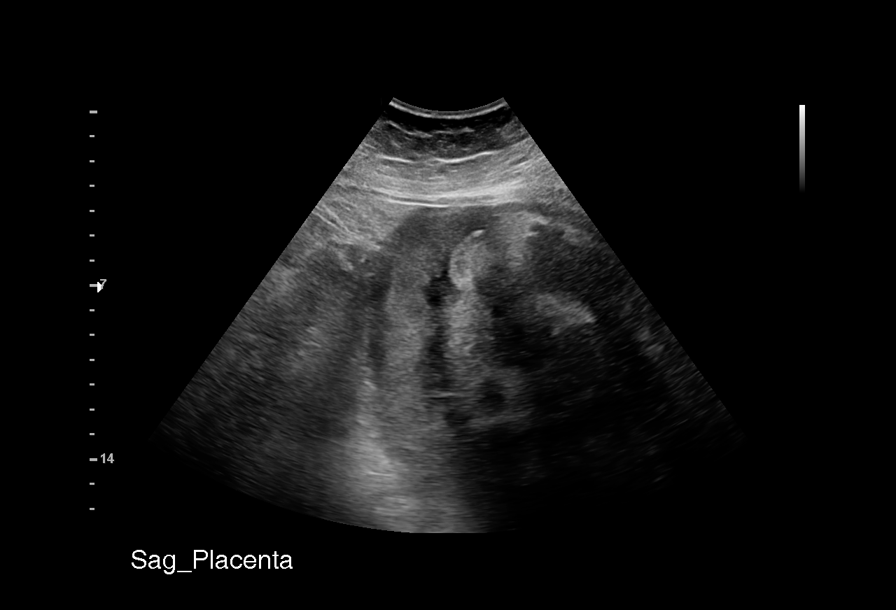
[im 32/34]
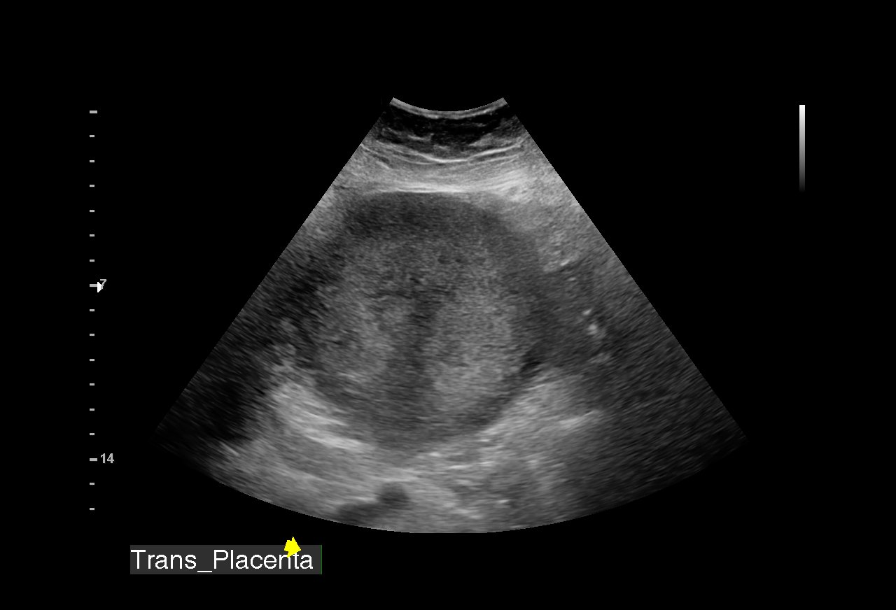

[13 of 28 positions shown; findings below may reference images not displayed]

Name:        [facility]] [facility]               Visit Date: 06/29/2018 [DATE]
              [facility]

 ----------------------------------------------------------------------

 ----------------------------------------------------------------------
Indications

  Hypertension - Chronic/Pre-existing (labetalol)
  30 weeks gestation of pregnancy
  Advanced maternal age multigravida 42, third
  trimester
  Obesity complicating pregnancy, third trimester
 ----------------------------------------------------------------------
Vital Signs

 BMI:
Fetal Evaluation

 Num Of Fetuses:          1
 Fetal Heart Rate(bpm):   152
 Cardiac Activity:        Observed
 Presentation:            Cephalic
 Placenta:                Posterior

 Amniotic Fluid
 AFI FV:      Within normal limits

 AFI Sum(cm)     %Tile       Largest Pocket(cm)
 9.85            13

 RUQ(cm)       RLQ(cm)        LUQ(cm)        LLQ(cm)

Biophysical Evaluation
 Amniotic F.V:   Within normal limits        F. Tone:         Observed
 F. Movement:    Observed                    Score:           [DATE]
 F. Breathing:   Observed
OB History

 Gravidity:     3         Term:  1          Prem:  0        SAB:   1
 TOP:           0       Ectopic: 0         Living: 1
Gestational Age

 LMP:            36w 5d       Date:  10/15/17                   EDD:  07/22/18
 Best:           30w 6d    Det. By:  U/S  (04/30/18)            EDD:  09/01/18
Anatomy

 Stomach:                Appears normal, left   Bladder:                Appears normal
                         sided

 Other:   Technically difficult due to maternal habitus.
Cervix Uterus Adnexa

 Cervix
 Not visualized (advanced GA >54wks)
Impression

 Biophysical profile [DATE] (-2 NRNST)
Recommendations

 Follow up BPP in 2 weeks.
 Repeat growth in 3 weeks.

## 2019-05-06 ENCOUNTER — Ambulatory Visit (INDEPENDENT_AMBULATORY_CARE_PROVIDER_SITE_OTHER): Payer: Medicaid Other | Admitting: Obstetrics and Gynecology

## 2019-05-06 ENCOUNTER — Other Ambulatory Visit (HOSPITAL_COMMUNITY)
Admission: RE | Admit: 2019-05-06 | Discharge: 2019-05-06 | Disposition: A | Discharge status: Home / Self Care | Payer: Medicaid Other | Source: Ambulatory Visit | Attending: Obstetrics and Gynecology | Admitting: Obstetrics and Gynecology

## 2019-05-06 ENCOUNTER — Encounter: Payer: Self-pay | Admitting: Obstetrics and Gynecology

## 2019-05-06 ENCOUNTER — Other Ambulatory Visit: Payer: Self-pay

## 2019-05-06 VITALS — BP 172/127 | HR 98 | Ht 60.0 in | Wt 231.0 lb

## 2019-05-06 DIAGNOSIS — Z3042 Encounter for surveillance of injectable contraceptive: Secondary | ICD-10-CM | POA: Diagnosis not present

## 2019-05-06 DIAGNOSIS — Z1231 Encounter for screening mammogram for malignant neoplasm of breast: Secondary | ICD-10-CM | POA: Diagnosis not present

## 2019-05-06 DIAGNOSIS — Z3009 Encounter for other general counseling and advice on contraception: Secondary | ICD-10-CM | POA: Diagnosis not present

## 2019-05-06 DIAGNOSIS — Z01419 Encounter for gynecological examination (general) (routine) without abnormal findings: Secondary | ICD-10-CM | POA: Diagnosis present

## 2019-05-06 DIAGNOSIS — Z0000 Encounter for general adult medical examination without abnormal findings: Secondary | ICD-10-CM

## 2019-05-06 DIAGNOSIS — Z01411 Encounter for gynecological examination (general) (routine) with abnormal findings: Secondary | ICD-10-CM | POA: Insufficient documentation

## 2019-05-06 MED ORDER — MEDROXYPROGESTERONE ACETATE 150 MG/ML IM SUSP
150.0000 mg | Freq: Once | INTRAMUSCULAR | Status: AC
  Administered 2019-05-06: 15:00:00 150 mg via INTRAMUSCULAR

## 2019-05-06 NOTE — Addendum Note (Signed)
Addended by: Ernestina Patches on: 05/06/2019 03:30 PM   Modules accepted: Orders

## 2019-05-06 NOTE — Progress Notes (Signed)
GYNECOLOGY ANNUAL PREVENTATIVE CARE ENCOUNTER NOTE  History:     Lindsay Keith is a 44 y.o. [phone] female here for a routine annual gynecologic exam.  Current complaints: None.   Denies abnormal vaginal bleeding, discharge, pelvic pain, problems with intercourse or other gynecologic concerns.  On menstrual cycle    Gynecologic History Patient's last menstrual period was 05/05/2019 (exact date). Contraception: none Last Pap: 2018. Results were: normal with negative HPV Last mammogram: NA.scheduled.   Obstetric History OB History  Gravida Para Term Preterm AB Living  3 2 1 1 1 2   SAB TAB Ectopic Multiple Live Births  1 0 0 0 2    # Outcome Date GA Lbr Len/2nd Weight Sex Delivery Anes PTL Lv  3 Preterm 07/18/18 [redacted]w[redacted]d  3 lb 7.4 oz (1.57 kg) F CS-LTranv Spinal  LIV  2 SAB 09/2016          1 Term 12/22/91 [redacted]w[redacted]d  3 lb 13.4 oz (1.741 kg) F   N LIV    Past Medical History:  Diagnosis Date  . Asthma   . Bartholin's gland abscess 02/27/2011  . Depression   . GERD (gastroesophageal reflux disease)   . Gestational diabetes mellitus 06/18/2018  . Hypertension     Past Surgical History:  Procedure Laterality Date  . CESAREAN SECTION N/A 07/18/2018   Procedure: CESAREAN SECTION;  Surgeon: 07/20/2018, MD;  Location: [facility];  Service: Obstetrics;  Laterality: N/A;  . WISDOM TOOTH EXTRACTION      Current Outpatient Medications on File Prior to Visit  Medication Sig Dispense Refill  . albuterol (VENTOLIN HFA) 108 (90 Base) MCG/ACT inhaler Inhale 1-2 puffs into the lungs every 6 (six) hours as needed for wheezing or shortness of breath. 8 g 0  . amLODipine (NORVASC) 10 MG tablet Take 1 tablet (10 mg total) by mouth daily. 30 tablet 1  . Cetirizine HCl 10 MG CAPS Take 1 capsule (10 mg total) by mouth daily. 30 capsule 0  . enalapril (VASOTEC) 5 MG tablet Take 1 tablet (5 mg total) by mouth daily. 30 tablet 1  . fluticasone (FLONASE) 50 MCG/ACT nasal spray Place 1-2  sprays into both nostrils daily for 7 days. 1 g 0  . ibuprofen (ADVIL,MOTRIN) 600 MG tablet Take 1 tablet (600 mg total) by mouth every 6 (six) hours as needed for mild pain. 30 tablet 1  . Prenatal Vit-Fe Fumarate-FA (PRENATAL MULTIVITAMIN) TABS tablet Take 1 tablet by mouth daily at 12 noon.    . ranitidine (ZANTAC) 150 MG capsule Take 1 capsule (150 mg total) by mouth 2 (two) times daily. 60 capsule 2   No current facility-administered medications on file prior to visit.    No Known Allergies  Social History:  reports that she has been smoking. She has been smoking about 0.25 packs per day. She has never used smokeless tobacco. She reports previous alcohol use. She reports current drug use. Drug: Marijuana.  No family history on file.  The following portions of the patient's history were reviewed and updated as appropriate: allergies, current medications, past family history, past medical history, past social history, past surgical history and problem list.  Review of Systems Pertinent items noted in HPI and remainder of comprehensive ROS otherwise negative.  Physical Exam:  BP (!) 185/138   Pulse 98   Ht 5' (1.524 m)   Wt 231 lb (104.8 kg)   LMP 05/05/2019 (Exact Date)   Breastfeeding No   BMI  45.11 kg/m  CONSTITUTIONAL: Well-developed, well-nourished female in no acute distress.  HENT:  Normocephalic, atraumatic, External right and left ear normal. Oropharynx is clear and moist EYES: Conjunctivae and EOM are normal. Pupils are equal, round, and reactive to light. No scleral icterus.  NECK: Normal range of motion, supple, no masses.  Normal thyroid.  SKIN: Skin is warm and dry. No rash noted. Not diaphoretic. No erythema. No pallor. MUSCULOSKELETAL: Normal range of motion. No tenderness.  No cyanosis, clubbing, or edema.  2+ distal pulses. NEUROLOGIC: Alert and oriented to person, place, and time. Normal reflexes, muscle tone coordination.  PSYCHIATRIC: Normal mood and affect.  Normal behavior. Normal judgment and thought content. CARDIOVASCULAR: Normal heart rate noted, regular rhythm RESPIRATORY: Clear to auscultation bilaterally. Effort and breath sounds normal, no problems with respiration noted. BREASTS: Symmetric in size. No masses, tenderness, skin changes, nipple drainage, or lymphadenopathy bilaterally. ABDOMEN: Soft, no distention noted.  No tenderness, rebound or guarding.  PELVIC: Normal appearing external genitalia and urethral meatus; normal appearing vaginal mucosa and cervix.  Menstrual bleeding noted.  Pap smear obtained.  Normal uterine size, no other palpable masses, no uterine or adnexal tenderness.   Assessment and Plan:    1. Encounter for gynecological examination  - Cytology - PAP( [place])  2. Visit for screening mammogram  - MM Digital Screening; Future  3. Birth control counseling  Desires nexplanon, we are out at the moment. Depo today and she can return for Nexplanon placement.    [name], Artist Pais, NP Will follow up results of pap smear and manage accordingly. Mammogram scheduled Routine preventative health maintenance measures emphasized. Please refer to After Visit Summary for other counseling recommendations.        Product/process development scientist for [employer], [place]

## 2019-05-12 LAB — CYTOLOGY - PAP
Comment: NEGATIVE
Diagnosis: NEGATIVE
High risk HPV: NEGATIVE

## 2019-05-15 IMAGING — US US MFM FETAL BPP W/NONSTRESS
1 series · 14 of 19 positions shown · non-contrast
Comparison: none

[Series 1: us mfm fetal bpp w/nonstress · 19 acquisitions, 14 frames shown]
[im 1/19]
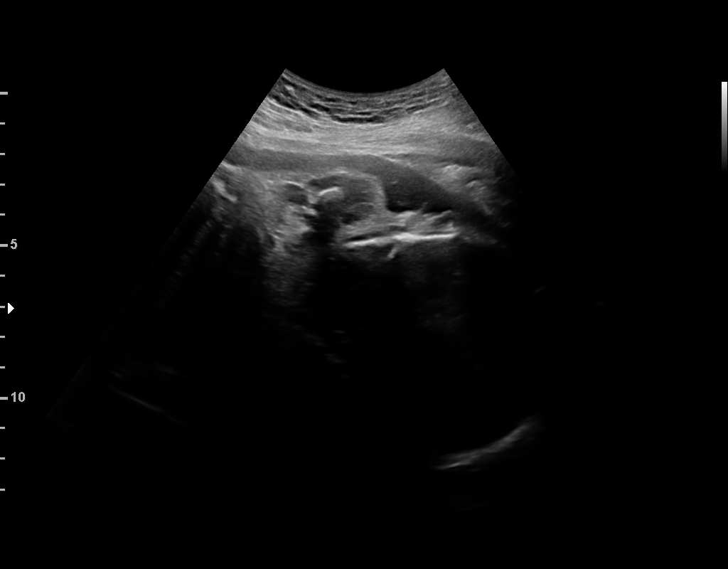
[im 3/19]
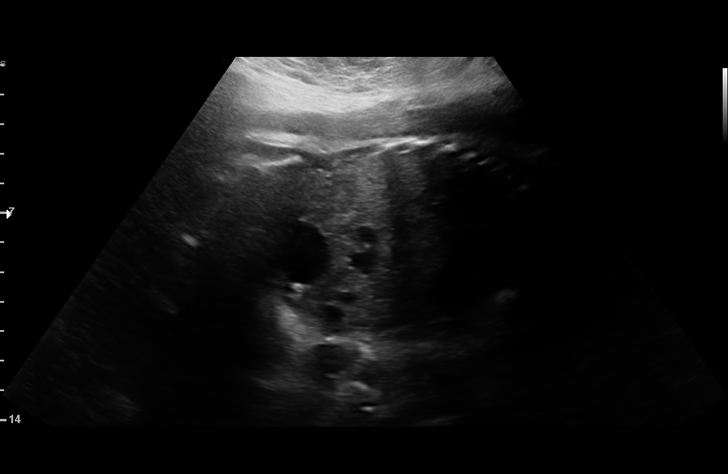
[im 4/19]
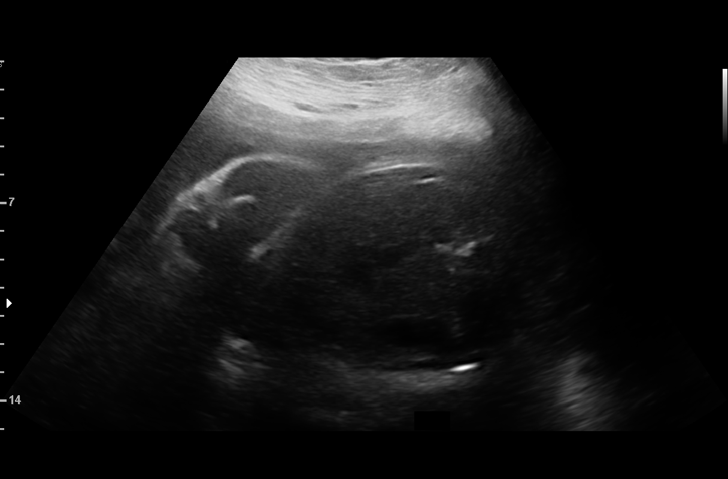
[im 5/19]
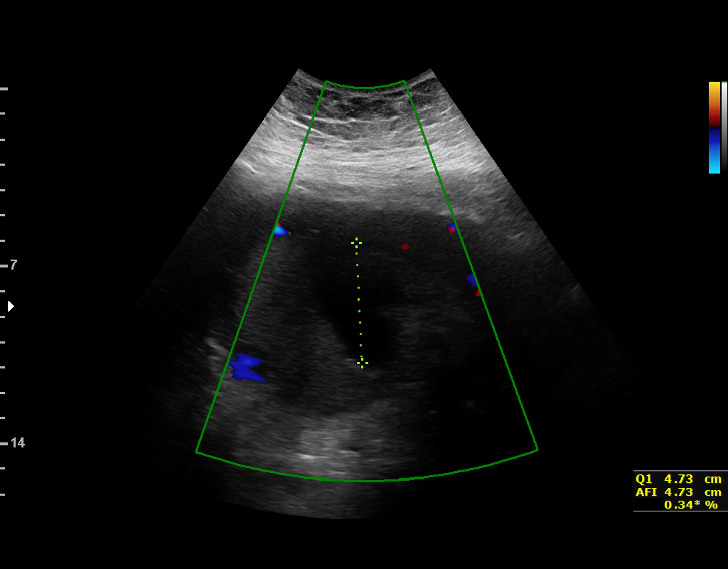
[im 7/19]
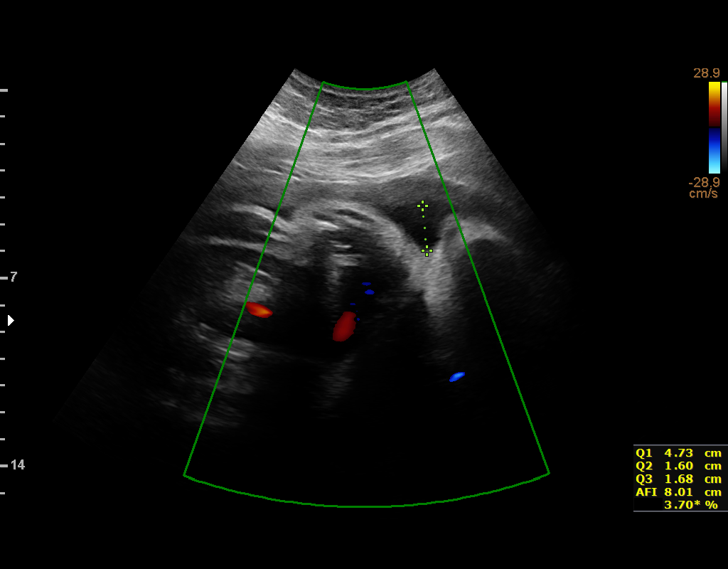
[im 8/19]
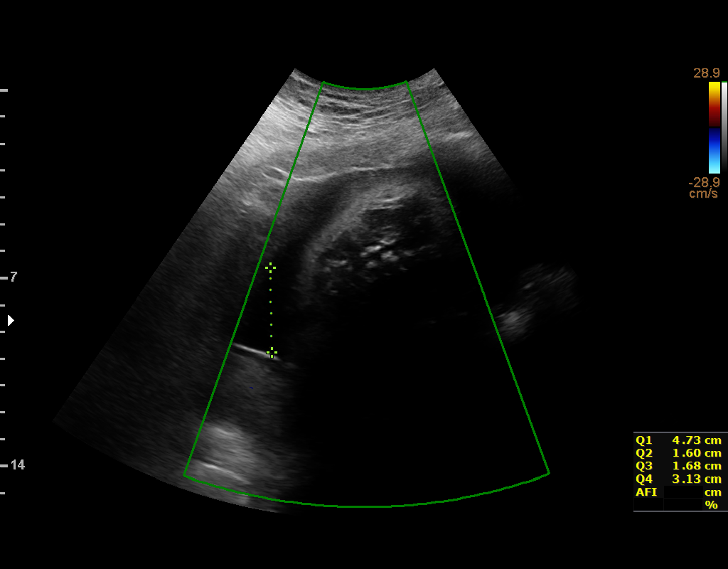
[im 9/19]
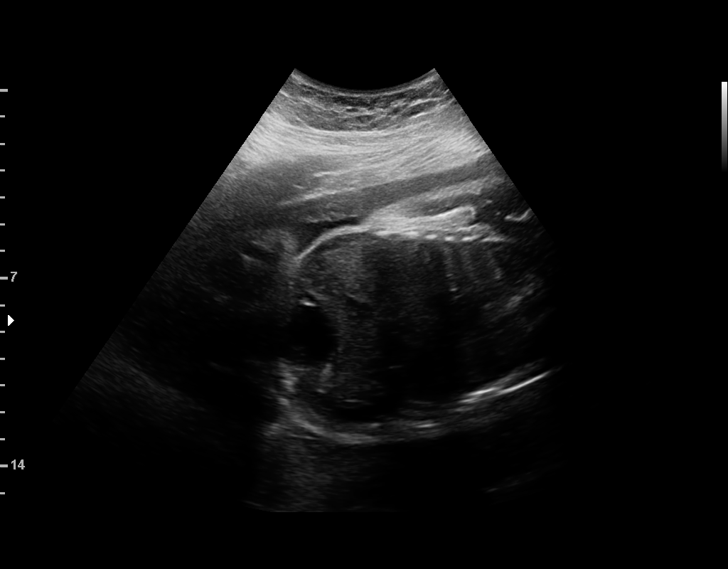
[im 11/19]
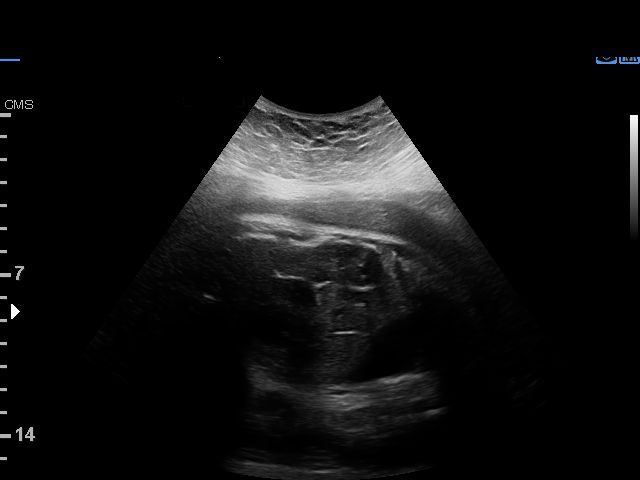
[im 12/19]
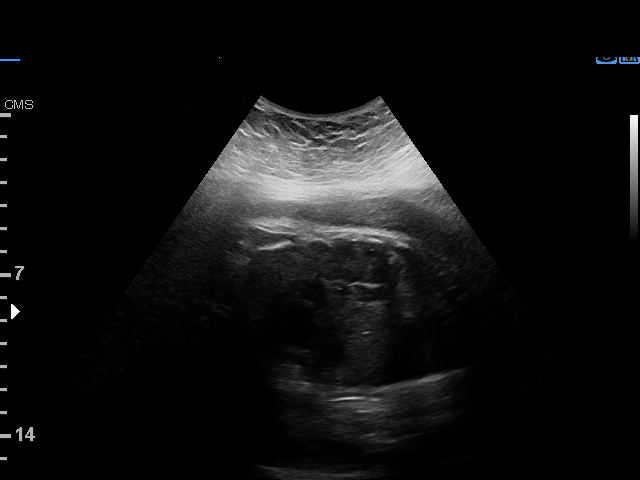
[im 13/19]
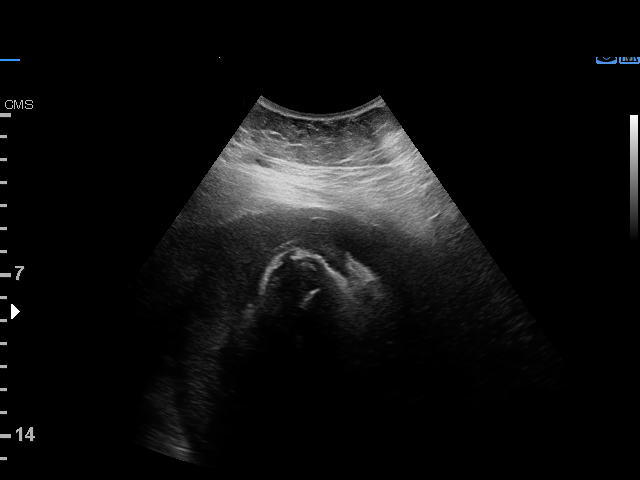
[im 15/19]
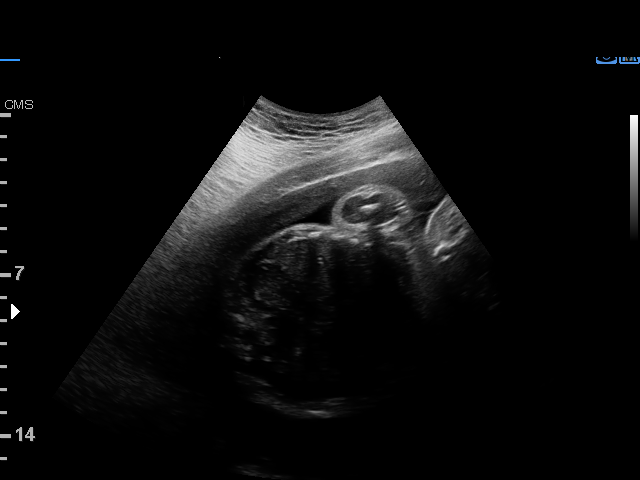
[im 16/19]
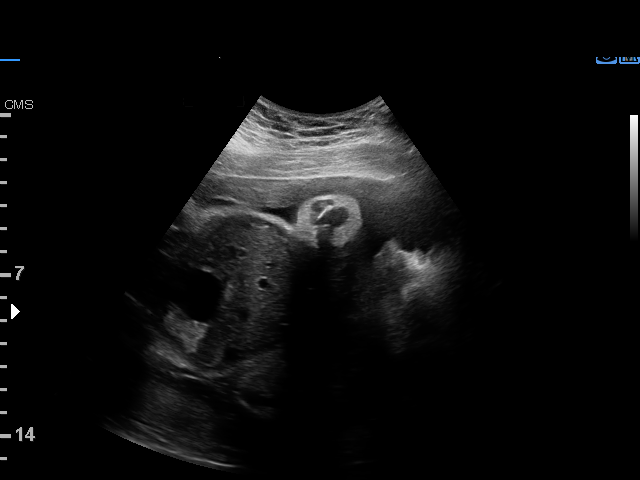
[im 17/19]
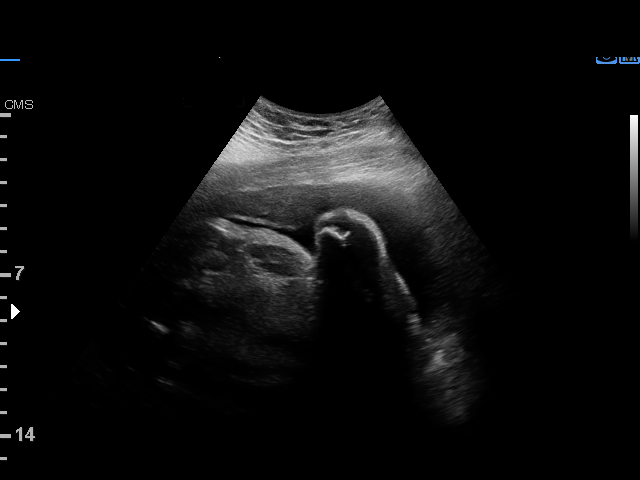
[im 19/19]
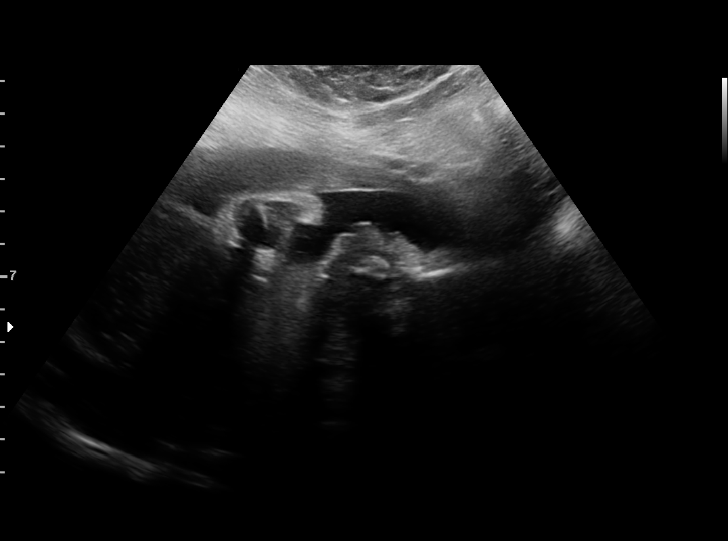

[14 of 19 positions shown; findings below may reference images not displayed]

Name:       [facility]] [facility]               Visit Date: 07/09/2018 [DATE]
             [facility]


     W/NONSTRESS                                       CANDACE
 ----------------------------------------------------------------------

 ----------------------------------------------------------------------
Indications

  Hypertension - Chronic/Pre-existing
  (labetalol)
  Advanced maternal age multigravida 42,
  third trimester
  Obesity complicating pregnancy, third
  trimester
  Non-reactive NST
  32 weeks gestation of pregnancy
 ----------------------------------------------------------------------
Vital Signs

                                                Height:        5'1"
Fetal Evaluation

 Num Of Fetuses:         1
 Fetal Heart Rate(bpm):  144
 Cardiac Activity:       Observed
 Presentation:           Cephalic

 Amniotic Fluid
 AFI FV:      Within normal limits

 AFI Sum(cm)     %Tile       Largest Pocket(cm)
 11.14           25

 RUQ(cm)       RLQ(cm)       LUQ(cm)        LLQ(cm)

Biophysical Evaluation
 Amniotic F.V:   Within normal limits       F. Tone:        Observed
 F. Movement:    Observed                   N.S.T:          Nonreactive
 F. Breathing:   Observed                   Score:          [DATE]
OB History

 Gravidity:    3         Term:   1        Prem:   0        SAB:   1
 TOP:          0       Ectopic:  0        Living: 1
Gestational Age

 LMP:           38w 1d        Date:  10/15/17                 EDD:   07/22/18
 Best:          32w 2d     Det. By:  U/S  (04/30/18)          EDD:   09/01/18
Impression

 Chronic hypertension. Patient takes labetalol 400 mg twice
 daily.
 Amniotic fluid is normal and good fetal activity is seen.
 Antenatal testing is reassuring. NST is nonreactive. BPP
 [DATE].
 BP at our office: 153/100, 162/104 and 154/103 mm Hg
 (final). Patient does not have symptoms of severe features of
 preeclampsia.
Recommendations

 -Continue weekly BPP and NST till delivery.
                 [employer], [employer]

## 2019-06-03 ENCOUNTER — Encounter: Payer: Self-pay | Admitting: Obstetrics and Gynecology

## 2019-06-03 ENCOUNTER — Ambulatory Visit (INDEPENDENT_AMBULATORY_CARE_PROVIDER_SITE_OTHER): Payer: Medicaid Other | Admitting: Obstetrics and Gynecology

## 2019-06-03 ENCOUNTER — Other Ambulatory Visit: Payer: Self-pay

## 2019-06-03 VITALS — BP 160/110 | HR 94 | Ht 61.0 in | Wt 231.0 lb

## 2019-06-03 DIAGNOSIS — Z30017 Encounter for initial prescription of implantable subdermal contraceptive: Secondary | ICD-10-CM | POA: Diagnosis not present

## 2019-06-03 DIAGNOSIS — Z3046 Encounter for surveillance of implantable subdermal contraceptive: Secondary | ICD-10-CM

## 2019-06-03 DIAGNOSIS — Z3202 Encounter for pregnancy test, result negative: Secondary | ICD-10-CM | POA: Diagnosis not present

## 2019-06-03 LAB — POCT PREGNANCY, URINE: Preg Test, Ur: NEGATIVE

## 2019-06-03 MED ORDER — ALBUTEROL SULFATE HFA 108 (90 BASE) MCG/ACT IN AERS: 1.0000 | INHALATION_SPRAY | Freq: Four times a day (QID) | RESPIRATORY_TRACT | 0 refills | Status: DC | PRN

## 2019-06-03 MED ORDER — ENALAPRIL MALEATE 5 MG PO TABS: 5.0000 mg | ORAL_TABLET | Freq: Every day | ORAL | 1 refills | Status: DC

## 2019-06-03 MED ORDER — AMLODIPINE BESYLATE 10 MG PO TABS: 10.0000 mg | ORAL_TABLET | Freq: Every day | ORAL | 1 refills | Status: DC

## 2019-06-03 MED ORDER — ETONOGESTREL 68 MG ~~LOC~~ IMPL
68.0000 mg | DRUG_IMPLANT | Freq: Once | SUBCUTANEOUS | Status: AC
  Administered 2019-06-03: 09:00:00 68 mg via SUBCUTANEOUS

## 2019-06-03 NOTE — Progress Notes (Signed)
     GYNECOLOGY OFFICE PROCEDURE NOTE  Lindsay Keith is a 44 y.o. [id number] here for Nexplanon insertion.  No other gynecologic concerns.  Nexplanon Insertion Procedure Patient identified, informed consent performed, consent signed.   Patient does understand that irregular bleeding is a very common side effect of this medication. She was advised to have backup contraception for one week after placement. Pregnancy test in clinic today was negative.  Appropriate time out taken.  Patient's left arm was prepped and draped in the usual sterile fashion. The ruler used to measure and mark insertion area.  Patient was prepped with alcohol swab and then injected with 3 ml of 1% lidocaine.  She was prepped with betadine, Nexplanon removed from packaging,  Device confirmed in needle, then inserted full length of needle and withdrawn per handbook instructions. Nexplanon was able to palpated in the patient's arm; patient palpated the insert herself. There was minimal blood loss.  Patient insertion site covered with guaze and a pressure bandage to reduce any bruising.  The patient tolerated the procedure well and was given post procedure instructions.    BP severely elevated today. She is not taking BP medications d/t no refills left. Rx sent: encouraged her to call PCP ASAP    Vonzell Lindblad, Harolyn Rutherford, NP [facility] [employer], [facility]

## 2019-06-16 ENCOUNTER — Ambulatory Visit: Payer: Medicaid Other

## 2019-07-23 ENCOUNTER — Ambulatory Visit: Payer: Medicaid Other

## 2019-08-23 ENCOUNTER — Encounter: Payer: Self-pay | Admitting: *Deleted

## 2019-10-25 ENCOUNTER — Other Ambulatory Visit: Payer: Self-pay

## 2019-10-25 ENCOUNTER — Ambulatory Visit (INDEPENDENT_AMBULATORY_CARE_PROVIDER_SITE_OTHER): Payer: Medicaid Other

## 2019-10-25 DIAGNOSIS — Z111 Encounter for screening for respiratory tuberculosis: Secondary | ICD-10-CM | POA: Diagnosis not present

## 2019-10-25 NOTE — Progress Notes (Signed)
Patient is here for a PPD placement.  It was placed on 10/25/2019 in the left forearm @ 2:25 pm.    Patient scheduled for nurse visit on 6/30 for 2:30 p.m. Reminder card given.    Veronda Prude, RN

## 2019-10-27 ENCOUNTER — Other Ambulatory Visit: Payer: Self-pay

## 2019-10-27 ENCOUNTER — Ambulatory Visit (INDEPENDENT_AMBULATORY_CARE_PROVIDER_SITE_OTHER): Payer: Medicaid Other

## 2019-10-27 DIAGNOSIS — Z111 Encounter for screening for respiratory tuberculosis: Secondary | ICD-10-CM

## 2019-10-27 LAB — TB SKIN TEST
Induration: 0 mm
TB Skin Test: NEGATIVE

## 2019-10-27 NOTE — Progress Notes (Signed)
Patient is here for a PPD read.  It was placed on 10/25/2019 in the left forearm @ 1425.   PPD RESULTS:  Result: negative Induration: 0 mm  Letter created and given to patient for documentation purposes. Veronda Prude, RN

## 2019-11-02 ENCOUNTER — Encounter: Payer: Medicaid Other | Admitting: Family Medicine

## 2019-11-02 ENCOUNTER — Encounter: Payer: Self-pay | Admitting: Family Medicine

## 2019-11-02 NOTE — Progress Notes (Deleted)
    SUBJECTIVE:   CHIEF COMPLAINT / HPI:   HTN amlod 10 mg Enalapril -bmp  Hx of GDM -A1c   PERTINENT  PMH / PSH: ***  OBJECTIVE:   There were no vitals taken for this visit.  ***  ASSESSMENT/PLAN:   No problem-specific Assessment & Plan notes found for this encounter.     Mirian Mo, MD [facility]

## 2019-11-08 ENCOUNTER — Encounter: Payer: Medicaid Other | Admitting: Family Medicine

## 2021-01-24 ENCOUNTER — Encounter: Payer: Self-pay | Admitting: Family Medicine

## 2021-01-24 ENCOUNTER — Ambulatory Visit (INDEPENDENT_AMBULATORY_CARE_PROVIDER_SITE_OTHER): Payer: Medicaid Other | Admitting: Family Medicine

## 2021-01-24 ENCOUNTER — Other Ambulatory Visit: Payer: Self-pay

## 2021-01-24 ENCOUNTER — Telehealth: Payer: Self-pay | Admitting: Student

## 2021-01-24 VITALS — BP 191/131 | HR 99 | Ht 61.0 in | Wt 224.8 lb

## 2021-01-24 DIAGNOSIS — Z3046 Encounter for surveillance of implantable subdermal contraceptive: Secondary | ICD-10-CM

## 2021-01-24 DIAGNOSIS — Z8632 Personal history of gestational diabetes: Secondary | ICD-10-CM

## 2021-01-24 DIAGNOSIS — I10 Essential (primary) hypertension: Secondary | ICD-10-CM

## 2021-01-24 DIAGNOSIS — R7309 Other abnormal glucose: Secondary | ICD-10-CM | POA: Diagnosis not present

## 2021-01-24 DIAGNOSIS — J301 Allergic rhinitis due to pollen: Secondary | ICD-10-CM | POA: Diagnosis not present

## 2021-01-24 DIAGNOSIS — Z23 Encounter for immunization: Secondary | ICD-10-CM | POA: Diagnosis not present

## 2021-01-24 MED ORDER — CETIRIZINE HCL 10 MG PO CAPS: 10.0000 mg | ORAL_CAPSULE | Freq: Every day | ORAL | 0 refills | Status: DC

## 2021-01-24 MED ORDER — OLMESARTAN MEDOXOMIL 40 MG PO TABS: 40.0000 mg | ORAL_TABLET | Freq: Every day | ORAL | 3 refills | Status: DC

## 2021-01-24 MED ORDER — AMLODIPINE BESYLATE 10 MG PO TABS: 10.0000 mg | ORAL_TABLET | Freq: Every day | ORAL | 1 refills | Status: DC

## 2021-01-24 MED ORDER — FLUTICASONE PROPIONATE 50 MCG/ACT NA SUSP: 1.0000 | Freq: Every day | NASAL | 0 refills | Status: DC

## 2021-01-24 NOTE — Progress Notes (Deleted)
   SUBJECTIVE:   CHIEF COMPLAINT / HPI:   Chief Complaint  Patient presents with   Clerance forms     Lindsay Keith is a 45 y.o. female here for ***   Pt reports ***    PERTINENT  PMH / PSH: reviewed and updated as appropriate   OBJECTIVE:   BP (!) 182/134   Pulse 99   Ht 5\' 1"  (1.549 m)   Wt 224 lb 12.8 oz (102 kg)   LMP  (LMP Unknown)   SpO2 100%   BMI 42.48 kg/m   ***  ASSESSMENT/PLAN:   No problem-specific Assessment & Plan notes found for this encounter.     , DO PGY-3, [facility] 01/24/2021      {    This will disappear when note is signed, click to select method of visit    :1}

## 2021-01-24 NOTE — Progress Notes (Signed)
   SUBJECTIVE:   CHIEF COMPLAINT / HPI:   Chief Complaint  Patient presents with   Clerance forms     Lindsay Keith is a 45 y.o. female here for clearance forms from her job. 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 BP at outside facility was 190/112. Pt states she is taking her BP medications. Occasionally has swelling of her hands and feet but none today.  Denies headache, vision changes, dizziness, shortness of breath and chest pain.     PERTINENT  PMH / PSH: reviewed and updated as appropriate   OBJECTIVE:   BP (!) 182/134  Pulse 99   Ht 5\' 1"  (1.549 m)   Wt 224 lb 12.8 oz (102 kg)   LMP  (LMP Unknown)   SpO2 100%   BMI 42.48 kg/m    GEN: well developed female, in no acute distress  CV: regular rate and rhythm, no murmurs appreciated  RESP: no increased work of breathing, clear to ascultation bilaterally ABD: Bowel sounds present. Soft, non-tender, non-distended.  MSK: minimal ankle edema, no calf tenderness,  SKIN: warm, venous stasis changes on RLE    ASSESSMENT/PLAN:   ESSENTIAL HYPERTENSION, BENIGN Uncontrolled.  Initial BP 182/134.  Repeat after 10 to 15 minutes BP 191/131. Previously was prescribed enalapril and amlodipine. On chart review, patient was only given 180-day supply of these medications.   Though she initially reported having these medications at home she requested a refill.  Patient has not been seen at [facility] for at least 2-1/2 years.  Unfortunately her clearance form stated that her blood pressure has to be under 159/99.  I was unable to safely clear this patient to return to work.   Discussed complications of severely elevated BP.  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  She may benefit from an echo in the future.  Restart amlodipine 10 mg.  Start olmesartan 40 mg.  Obtain BMP today.  Patient to follow-up in 1 week for repeat BMP.  Future order placed.  In office follow-up in 1 to 2 weeks.  ED precautions given. Pt voiced  understanding.   RHINITIS, ALLERGIC Stable. Refilled Flonase and Zyrtec at pt request.   History of gestational diabetes Pt has been lost to follow up. Obtain an a1c.   Has Nexplanon placed after delivery in 2020. Pt would like this removed. Is interested in BTL.    2021, DO PGY-3, [facility] 01/24/2021

## 2021-01-24 NOTE — Patient Instructions (Signed)
It was great seeing you today!  Please check-out at the front desk before leaving the clinic. I'd like to see you back in 1-2 weeks.  If you can't get an appointment soon be sure to stop by the lab for you repeat check on you kidneys.   Visit Remembers: - Stop by the pharmacy to pick up your prescriptions  - Continue to work on your healthy eating habits and incorporating exercise into your daily life.  - Your goal is to have an BP < 120/80   Regarding lab work today:  Due to recent changes in healthcare laws, you may see the results of your imaging and laboratory studies on MyChart before your provider has had a chance to review them.  I understand that in some cases there may be results that are confusing or concerning to you. Not all laboratory results come back in the same time frame and you may be waiting for multiple results in order to interpret others.  Please give [place] 72 hours in order for your provider to thoroughly review all the results before contacting the office for clarification of your results. If everything is normal, you will get a letter in the mail or a message in My Chart. Please give [place] a call if you do not hear from [place] after 2 weeks.  Please bring all of your medications with you to each visit.    If you haven't already, sign up for My Chart to have easy access to your labs results, and communication with your primary care physician.  Feel free to call with any questions or concerns at any time, at [phone].   Take care,  Dr. Katherina Right [facility]

## 2021-01-25 ENCOUNTER — Encounter: Payer: Self-pay | Admitting: Family Medicine

## 2021-01-25 LAB — LIPID PANEL
Chol/HDL Ratio: 5.1 ratio — ABNORMAL HIGH (ref 0.0–4.4)
Cholesterol, Total: 158 mg/dL (ref 100–199)
HDL: 31 mg/dL — ABNORMAL LOW (ref >39)
LDL Chol Calc (NIH): 102 mg/dL — ABNORMAL HIGH (ref 0–99)
Triglycerides: 140 mg/dL (ref 0–149)
VLDL Cholesterol Cal: 25 mg/dL (ref 5–40)

## 2021-01-25 LAB — BASIC METABOLIC PANEL
BUN/Creatinine Ratio: 11 (ref 9–23)
BUN: 9 mg/dL (ref 6–24)
CO2: 30 mmol/L — ABNORMAL HIGH (ref 20–29)
Calcium: 9.6 mg/dL (ref 8.7–10.2)
Chloride: 93 mmol/L — ABNORMAL LOW (ref 96–106)
Creatinine, Ser: 0.82 mg/dL (ref 0.57–1.00)
Glucose: 256 mg/dL — ABNORMAL HIGH (ref 70–99)
Potassium: 4.1 mmol/L (ref 3.5–5.2)
Sodium: 138 mmol/L (ref 134–144)
eGFR: 90 mL/min/{1.73_m2} (ref >59)

## 2021-01-25 NOTE — Assessment & Plan Note (Signed)
Stable. Refilled Flonase and Zyrtec at pt request.

## 2021-01-25 NOTE — Assessment & Plan Note (Addendum)
Uncontrolled.  Initial BP 182/134.  Repeat after 10 to 15 minutes BP 191/131. Previously was prescribed enalapril and amlodipine. On chart review, patient was only given 180-day supply of these medications.   Though she initially reported having these medications at home she requested a refill.  Patient has not been seen at [facility] for at least 2-1/2 years.  Unfortunately her clearance form stated that her blood pressure has to be under 159/99.  I was unable to safely clear this patient to return to work.   Discussed complications of severely elevated BP.  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  She may benefit from an echo in the future.  Restart amlodipine 10 mg.  Start olmesartan 40 mg.  Obtain BMP today.  Patient to follow-up in 1 week for repeat BMP.  Future order placed.  In office follow-up in 1 to 2 weeks.  ED precautions given. Pt voiced understanding.

## 2021-01-25 NOTE — Assessment & Plan Note (Signed)
Pt has been lost to follow up. Obtain an a1c.

## 2021-10-02 ENCOUNTER — Encounter: Payer: Self-pay | Admitting: *Deleted

## 2021-10-08 ENCOUNTER — Ambulatory Visit: Payer: Medicaid Other | Admitting: Student

## 2021-10-08 ENCOUNTER — Encounter: Payer: Self-pay | Admitting: Student

## 2021-10-08 VITALS — BP 154/99 | HR 96 | Ht 61.0 in | Wt 225.8 lb

## 2021-10-08 DIAGNOSIS — Z3046 Encounter for surveillance of implantable subdermal contraceptive: Secondary | ICD-10-CM

## 2021-10-08 DIAGNOSIS — J301 Allergic rhinitis due to pollen: Secondary | ICD-10-CM | POA: Diagnosis not present

## 2021-10-08 DIAGNOSIS — Z13228 Encounter for screening for other metabolic disorders: Secondary | ICD-10-CM | POA: Diagnosis not present

## 2021-10-08 DIAGNOSIS — I10 Essential (primary) hypertension: Secondary | ICD-10-CM

## 2021-10-08 MED ORDER — ALBUTEROL SULFATE HFA 108 (90 BASE) MCG/ACT IN AERS: 1.0000 | INHALATION_SPRAY | Freq: Four times a day (QID) | RESPIRATORY_TRACT | 0 refills | Status: DC | PRN

## 2021-10-08 MED ORDER — CETIRIZINE HCL 10 MG PO CAPS: 10.0000 mg | ORAL_CAPSULE | Freq: Every day | ORAL | 0 refills | Status: DC

## 2021-10-08 MED ORDER — HYDROCHLOROTHIAZIDE 12.5 MG PO TABS: 12.5000 mg | ORAL_TABLET | Freq: Every day | ORAL | 0 refills | Status: DC

## 2021-10-08 MED ORDER — OLMESARTAN MEDOXOMIL 40 MG PO TABS: 40.0000 mg | ORAL_TABLET | Freq: Every day | ORAL | 3 refills | Status: DC

## 2021-10-08 MED ORDER — AMLODIPINE BESYLATE 10 MG PO TABS: 10.0000 mg | ORAL_TABLET | Freq: Every day | ORAL | 1 refills | Status: DC

## 2021-10-08 NOTE — Assessment & Plan Note (Signed)
Still uncontrolled today 154/99.  -continue amlodipine 10 mg -continue olmesartan 40 mg -start HCTZ 12.5 mg daily -BMP -BP recheck in 2 weeks

## 2021-10-08 NOTE — Progress Notes (Signed)
    SUBJECTIVE:   CHIEF COMPLAINT / HPI:   Nexplanon removal Would like nexplanon removed and does not want any forma of contraception after. Does not want BTL as previously discussed  HTN Taking amlodipine 10 mg daily and olmesartan 40 mg daily. Denies any headache, vision changes, shortness of breath or chest pain   Hypertension: Patient is a 46 y.o. female who present today for follow up of hypertension.   Patient endorses taking amlodipine 10 mg daily and olmesartan 40 mg daily. Although patient has low indication signal on meds on Epic.   Most recent creatinine trend:  Lab Results  Component Value Date   CREATININE 0.82 01/24/2021   CREATININE 0.56 07/21/2018   CREATININE 0.60 07/17/2018   Patient does check blood pressure at home. Home BP ranges are 158/89-158/86.   Patient has had a BMP in the past 1 year.   PERTINENT  PMH / PSH: HTN  OBJECTIVE:   BP (!) 154/99   Pulse 96   Ht 5\' 1"  (1.549 m)   Wt 225 lb 12.8 oz (102.4 kg)   SpO2 100%   BMI 42.66 kg/m   General: Well appearing, NAD, awake, alert, responsive to questions Head: Normocephalic atraumatic CV: Regular rate and rhythm no murmurs rubs or gallops Respiratory: Clear to ausculation bilaterally, no wheezes rales or crackles, chest rises symmetrically,  no increased work of breathing Extremities:LUE with nexplanon moveable under skin Neuro: No focal deficits Skin: No rashes or lesions visualized   ASSESSMENT/PLAN:   ESSENTIAL HYPERTENSION, BENIGN Still uncontrolled today 154/99.  -continue amlodipine 10 mg -continue olmesartan 40 mg -start HCTZ 12.5 mg daily -BMP -BP recheck in 2 weeks   Plan for nexplanon removal at next visit (Scheduled for double visit)  , MD [facility]

## 2021-10-08 NOTE — Patient Instructions (Signed)
It was great to see you! Thank you for allowing me to participate in your care!   I recommend that you always bring your medications to each appointment as this makes it easy to ensure we are on the correct medications and helps [place] not miss when refills are needed.  Our plans for today:  - I have added 12.5 mg HCTZ daily, continue taking amlodipine and olmesartan - We will have you come back in 2 weeks to recheck BP and removed nexplanon -I refilled your other medications as well  We are checking some labs today, I will call you if they are abnormal will send you a MyChart message or a letter if they are normal.  If you do not hear about your labs in the next 2 weeks please let us know.  Take care and seek immediate care sooner if you develop any concerns. Please remember to show up 15 minutes before your scheduled appointment time!  Levin Erp, MD [facility]

## 2021-10-09 LAB — BASIC METABOLIC PANEL
BUN/Creatinine Ratio: 9 (ref 9–23)
BUN: 8 mg/dL (ref 6–24)
CO2: 27 mmol/L (ref 20–29)
Calcium: 8.8 mg/dL (ref 8.7–10.2)
Chloride: 99 mmol/L (ref 96–106)
Creatinine, Ser: 0.88 mg/dL (ref 0.57–1.00)
Glucose: 232 mg/dL — ABNORMAL HIGH (ref 70–99)
Potassium: 4.1 mmol/L (ref 3.5–5.2)
Sodium: 140 mmol/L (ref 134–144)
eGFR: 83 mL/min/{1.73_m2} (ref >59)

## 2021-10-10 ENCOUNTER — Other Ambulatory Visit: Payer: Self-pay | Admitting: Pharmacist

## 2021-10-10 NOTE — Chronic Care Management (AMB) (Signed)
Patient appearing on report for [place] [employer] - Hypertension Control report due to last documented ambulatory blood pressure of 154/99 on 10/08/21. Next appointment with PCP is 10/23/21   Patient is participating in a Managed Medicaid Plan:  Yes   Outreached patient via MyChart to discuss hypertension control and medication management. She denied any adherence or access concerns with her medications.    Follow up with PCP as scheduled.   Catie Eppie Gibson, PharmD, [facility] [phone]

## 2021-10-23 ENCOUNTER — Ambulatory Visit: Payer: Medicaid Other | Admitting: Family Medicine

## 2021-10-23 ENCOUNTER — Ambulatory Visit: Payer: Medicaid Other | Admitting: Student

## 2021-10-23 VITALS — BP 152/108 | HR 100 | Wt 222.0 lb

## 2021-10-23 DIAGNOSIS — E1165 Type 2 diabetes mellitus with hyperglycemia: Secondary | ICD-10-CM | POA: Diagnosis not present

## 2021-10-23 DIAGNOSIS — Z131 Encounter for screening for diabetes mellitus: Secondary | ICD-10-CM | POA: Diagnosis not present

## 2021-10-23 DIAGNOSIS — I10 Essential (primary) hypertension: Secondary | ICD-10-CM

## 2021-10-23 DIAGNOSIS — Z3046 Encounter for surveillance of implantable subdermal contraceptive: Secondary | ICD-10-CM | POA: Diagnosis not present

## 2021-10-23 LAB — POCT GLYCOSYLATED HEMOGLOBIN (HGB A1C): HbA1c, POC (controlled diabetic range): 9.1 % — AB (ref 0.0–7.0)

## 2021-10-23 MED ORDER — OLMESARTAN-AMLODIPINE-HCTZ 40-10-25 MG PO TABS: 1.0000 | ORAL_TABLET | Freq: Every day | ORAL | 11 refills | Status: DC

## 2021-10-23 MED ORDER — METFORMIN HCL ER 500 MG PO TB24: 500.0000 mg | ORAL_TABLET | Freq: Every day | ORAL | 3 refills | Status: DC

## 2021-10-23 NOTE — Assessment & Plan Note (Signed)
Chronic, uncontrolled. Consolidated BP medications into 1 pill with amlodipine 10 mg, olmesartan 40 mg, and HCTZ 25 mg. If still uncontrolled at next visit in 2 weeks, recommend work up for resistant hypertension with renin/aldosterone, renal US with doppler (recommend [place]), and adding spironolactone as 4th agent. Check BMP in 2 weeks.

## 2021-10-23 NOTE — Progress Notes (Signed)
    SUBJECTIVE:   CHIEF COMPLAINT / HPI:   HTN: blood pressure not at goal today 152/108. On amlodipine 10 mg, HCTZ 12.5 mg, olmesartan 40 mg daily. Asx.  Hyperglycemia: patient reports many family members with DMT2. Recently had blood work on 10/08/21 and noted hyperglycemia. Reviewed blood work with glucose 235, which qualifies as dx of diabetes. Will check A1c today.  Nexplanon: patient would like nexplanon removed. Discussed at length at last appt with Dr. Laroy Apple. She does not want BTL. Discussed risk of pregnancy, she understands risk. Declines depo provera bridge. Patient plans on using abstinence for birth control.   PERTINENT  PMH / PSH: hypertension  OBJECTIVE:   BP (!) 152/108   Pulse 100   Wt 222 lb (100.7 kg)   SpO2 99%   BMI 41.95 kg/m   Nursing note and vitals reviewed GEN: age-appropriate, AAW, resting comfortably in chair, NAD, class III obesity HEENT: NCAT. PERRLA. Sclera without injection or icterus. MMM.  Neck: Supple.  Cardiac: Regular rate and rhythm. Normal S1/S2. No murmurs, rubs, or gallops appreciated. 2+ radial pulses. Lungs: Clear bilaterally to ascultation. No increased WOB, no accessory muscle usage. No w/r/r. Neuro: AOx3  Ext: no edema, nexplanon easily palpable in LUE Psych: Pleasant and appropriate  ASSESSMENT/PLAN:   ESSENTIAL HYPERTENSION, BENIGN Chronic, uncontrolled. Consolidated BP medications into 1 pill with amlodipine 10 mg, olmesartan 40 mg, and HCTZ 25 mg. If still uncontrolled at next visit in 2 weeks, recommend work up for resistant hypertension with renin/aldosterone, renal US with doppler (recommend [place]), and adding spironolactone as 4th agent. Check BMP in 2 weeks.  Type 2 diabetes mellitus with hyperglycemia (HCC) New diagnosis of DMT2 today, A1c 9.1%. Referred patient to diabetes nutritionist and education. Recommend start with metformin 500 mg once daily and titrate up. Discussed risks of diabetes. She is interested in  weight loss, does not want surgery. Consider GLP1 in future. Also recommend UACR for baseline renal function at next visit as well as checking lipid panel and starting statin.  Encounter for Nexplanon removal PROCEDURE NOTE: NEXPLANON  REMOVAL Patient given informed consent and signed copy in the chart. L arm area prepped and draped in the usual sterile fashion. Three cc of lidocaine without epinephrine 1% used for local anesthesia. A small stab incision was made close to the nexplanon with scalpel. Hemostats were used to withdraw the nexplanon. A small bandage was applied over a steri strip  No complications.Patient given follow up instructions should she experience redness, swelling at sight or fever in the next 24 hours. Patient was reminded this totally removes her nexplanon contraceptive device (she can now potentially conceive).      Shirlean Mylar, MD [facility]

## 2021-10-26 ENCOUNTER — Other Ambulatory Visit: Payer: Self-pay | Admitting: Student

## 2021-10-26 DIAGNOSIS — Z3046 Encounter for surveillance of implantable subdermal contraceptive: Secondary | ICD-10-CM

## 2021-11-08 ENCOUNTER — Ambulatory Visit: Payer: Medicaid Other

## 2021-11-13 ENCOUNTER — Ambulatory Visit: Payer: Medicaid Other | Admitting: Student

## 2021-11-13 ENCOUNTER — Encounter: Payer: Self-pay | Admitting: Student

## 2021-11-13 VITALS — BP 140/100 | HR 93 | Ht 61.0 in | Wt 219.8 lb

## 2021-11-13 DIAGNOSIS — E1165 Type 2 diabetes mellitus with hyperglycemia: Secondary | ICD-10-CM | POA: Diagnosis not present

## 2021-11-13 DIAGNOSIS — I10 Essential (primary) hypertension: Secondary | ICD-10-CM | POA: Diagnosis not present

## 2021-11-13 DIAGNOSIS — Z3200 Encounter for pregnancy test, result unknown: Secondary | ICD-10-CM

## 2021-11-13 DIAGNOSIS — J301 Allergic rhinitis due to pollen: Secondary | ICD-10-CM | POA: Diagnosis not present

## 2021-11-13 LAB — POCT URINE PREGNANCY: Preg Test, Ur: NEGATIVE

## 2021-11-13 MED ORDER — FLUTICASONE PROPIONATE 50 MCG/ACT NA SUSP: 1.0000 | Freq: Every day | NASAL | 0 refills | Status: DC

## 2021-11-13 MED ORDER — EMPAGLIFLOZIN 10 MG PO TABS: 10.0000 mg | ORAL_TABLET | Freq: Every day | ORAL | 3 refills | Status: DC

## 2021-11-13 MED ORDER — ATORVASTATIN CALCIUM 40 MG PO TABS: 40.0000 mg | ORAL_TABLET | Freq: Every day | ORAL | 3 refills | Status: DC

## 2021-11-13 NOTE — Progress Notes (Unsigned)
    SUBJECTIVE:   CHIEF COMPLAINT / HPI:   Newly Diabetic Follow Up: Patient is a 46 y.o. female who present today for newly diabetic follow up.   Patient endorses difficulties with medication compliance  Home medications include: Patient endorses taking these medications as prescribed. Metformin has been causing profuse diarrhea and GI upset.   Most recent A1Cs:  Lab Results  Component Value Date   HGBA1C 9.1 (A) 10/23/2021   HGBA1C 6.5 (H) 07/13/2018   HGBA1C 6.4 (H) 04/09/2018   Last Microalbumin, LDL, Creatinine: Lab Results  Component Value Date   LDLCALC 86 11/13/2021   CREATININE 0.88 10/08/2021   Patient does not check blood glucose on a regular basis.  Hypertension: Patient is a 46 y.o. female who present today for follow up of hypertension.   Patient endorses difficulties with medication compliance  Home medications include: Olmesartan-amlodipine-hctz 40-10-25 mg. Patient endorses taking these medications as prescribed. (Has not been able to fill the combination pill, taking olmesartan 40, amlodipine 10 and HCTZ 25 separately)  Most recent creatinine trend:  Lab Results  Component Value Date   CREATININE 0.88 10/08/2021   CREATININE 0.82 01/24/2021   CREATININE 0.56 07/21/2018   Patient does check blood pressure at home. Highest 169/96. 140s have been lowest  Patient has had a BMP in the past 1 year.  S/p Nexplanon Patient not interested in contraception currently. Would like to check pregnancy test in office today.   Allergies Needs refill for flonase  PERTINENT  PMH / PSH: obesity  OBJECTIVE:   BP (!) 140/100   Pulse 93   Ht 5\' 1"  (1.549 m)   Wt 219 lb 12.8 oz (99.7 kg)   SpO2 96%   BMI 41.53 kg/m   General: Well appearing, NAD, awake, alert, responsive to questions Head: Normocephalic atraumatic CV: Regular rate and rhythm no murmurs rubs or gallops Respiratory: Clear to ausculation bilaterally, no wheezes rales or crackles, chest rises  symmetrically,  no increased work of breathing Abdomen: Soft, non-tender, non-distended, normoactive bowel sounds  Extremities: Moves upper and lower extremities freely, no edema in LE Neuro: No focal deficits Skin: No rashes or lesions visualized   ASSESSMENT/PLAN:   Type 2 diabetes mellitus with hyperglycemia (HCC) -D/c metformin due to intolerance -Start jardiance 10 mg daily (patient does not want injections currently) - Microalbumin/Creatinine Ratio, Urine - Lipid Panel -Statin medication started -F/u in 1 month for BMP recheck  ESSENTIAL HYPERTENSION, BENIGN taking olmesartan 40, amlodipine 10 and HCTZ 25 separately as she has not been able to fill combination pill. In office BP 140/100 currently. Just added SGLT2 (jardiance) today, will follow up with patient in 1 month to recheck BP. -monitor at home -return precautions discussed   Possible pregnancy - POCT urine pregnancy  Seasonal allergic rhinitis due to pollen - fluticasone (FLONASE) 50 MCG/ACT nasal spray; Place 1-2 sprays into both nostrils daily.  Dispense: 1 g; Refill: 0   , MD [facility]

## 2021-11-13 NOTE — Patient Instructions (Signed)
It was great to see you! Thank you for allowing me to participate in your care!   I recommend that you always bring your medications to each appointment as this makes it easy to ensure we are on the correct medications and helps [place] not miss when refills are needed.  Our plans for today:  - We will continue the medications for blood pressure now Olmesartan, amlodipine, hctz  - We will start a statin medication due to diabetes - we will stop metformin and start jardiance(different oral medication)  -follow up in 1 month for recheck of BP and your labs  We are checking some labs today, I will call you if they are abnormal will send you a MyChart message or a letter if they are normal.  If you do not hear about your labs in the next 2 weeks please let us know.  Take care and seek immediate care sooner if you develop any concerns. Please remember to show up 15 minutes before your scheduled appointment time!  Levin Erp, MD [facility]

## 2021-11-14 ENCOUNTER — Other Ambulatory Visit: Payer: Self-pay | Admitting: Student

## 2021-11-14 NOTE — Assessment & Plan Note (Addendum)
-  D/c metformin due to intolerance -Start jardiance 10 mg daily (patient does not want injections currently) - Microalbumin/Creatinine Ratio, Urine - Lipid Panel -Statin medication started -F/u in 1 month for BMP recheck

## 2021-11-14 NOTE — Assessment & Plan Note (Addendum)
taking olmesartan 40, amlodipine 10 and HCTZ 25 separately as she has not been able to fill combination pill. In office BP 140/100 currently. Just added SGLT2 (jardiance) today, will follow up with patient in 1 month to recheck BP. -monitor at home -return precautions discussed -consider work up for resistant htn if not controlled

## 2021-11-15 ENCOUNTER — Ambulatory Visit: Payer: Medicaid Other

## 2021-11-15 LAB — LIPID PANEL
Chol/HDL Ratio: 4.6 ratio — ABNORMAL HIGH (ref 0.0–4.4)
Cholesterol, Total: 147 mg/dL (ref 100–199)
HDL: 32 mg/dL — ABNORMAL LOW (ref >39)
LDL Chol Calc (NIH): 86 mg/dL (ref 0–99)
Triglycerides: 167 mg/dL — ABNORMAL HIGH (ref 0–149)
VLDL Cholesterol Cal: 29 mg/dL (ref 5–40)

## 2021-11-15 LAB — MICROALBUMIN / CREATININE URINE RATIO
Creatinine, Urine: 152.1 mg/dL
Microalb/Creat Ratio: 13 mg/g creat (ref 0–29)
Microalbumin, Urine: 19.8 ug/mL

## 2021-11-22 ENCOUNTER — Ambulatory Visit: Payer: Medicaid Other

## 2021-11-27 ENCOUNTER — Encounter: Payer: Medicaid Other | Attending: Family Medicine | Admitting: Dietician

## 2021-11-27 ENCOUNTER — Encounter: Payer: Self-pay | Admitting: Dietician

## 2021-11-27 DIAGNOSIS — E1165 Type 2 diabetes mellitus with hyperglycemia: Secondary | ICD-10-CM | POA: Diagnosis not present

## 2021-11-27 NOTE — Progress Notes (Signed)
Patient was seen on 11/27/2021 for the first of a series of three diabetes self[facility]management courses [facility]  Patient Education Plan per assessed needs and concerns is to attend three course education program for Diabetes Self Management Education.  A1C was 9.1% on 10/23/2021.  The following learning objectives were met by the patient during this class: Describe diabetes, types of diabetes and pathophysiology State some common risk factors for diabetes Defines the role of glucose and insulin Describe the relationship between diabetes and cardiovascular and other risks State the members of the Healthcare Team States the rationale for glucose monitoring and when to test State their individual [place] the importance of logging glucose readings and how to interpret the readings Identifies A1C target Explain the correlation between A1c and eAG values State symptoms and treatment of high blood glucose and low blood glucose Explain proper technique for glucose testing and identify proper sharps disposal  Handouts given during class include: How to Thrive:  A Guide for Your Journey with Diabetes by the ADA Meal Plan Card and carbohydrate content list Dietary intake form Low Sodium Flavoring Tips Types of Fats Dining Out Label reading Snack list The diabetes portion plate Diabetes Resources A1c to eAG Conversion Chart Blood Glucose Log Diabetes Recommended Care Schedule Support Group Diabetes Success Plan Core Class Satisfaction Survey   Follow-Up Plan: Attend core 2

## 2021-12-04 ENCOUNTER — Ambulatory Visit: Payer: Medicaid Other

## 2021-12-11 ENCOUNTER — Ambulatory Visit: Payer: Medicaid Other

## 2022-02-28 ENCOUNTER — Ambulatory Visit: Payer: Medicaid Other | Admitting: Dietician

## 2022-04-01 ENCOUNTER — Encounter: Payer: Self-pay | Admitting: Student

## 2022-04-01 ENCOUNTER — Ambulatory Visit (INDEPENDENT_AMBULATORY_CARE_PROVIDER_SITE_OTHER): Payer: Medicaid Other | Admitting: Student

## 2022-04-01 VITALS — BP 140/80 | HR 109 | Ht 61.0 in | Wt 225.6 lb

## 2022-04-01 DIAGNOSIS — Z3200 Encounter for pregnancy test, result unknown: Secondary | ICD-10-CM | POA: Diagnosis not present

## 2022-04-01 DIAGNOSIS — J301 Allergic rhinitis due to pollen: Secondary | ICD-10-CM | POA: Diagnosis not present

## 2022-04-01 DIAGNOSIS — E1165 Type 2 diabetes mellitus with hyperglycemia: Secondary | ICD-10-CM

## 2022-04-01 DIAGNOSIS — I10 Essential (primary) hypertension: Secondary | ICD-10-CM

## 2022-04-01 LAB — POCT GLYCOSYLATED HEMOGLOBIN (HGB A1C): HbA1c, POC (controlled diabetic range): 8.4 % — AB (ref 0.0–7.0)

## 2022-04-01 LAB — POCT URINE PREGNANCY: Preg Test, Ur: NEGATIVE

## 2022-04-01 MED ORDER — AMLODIPINE BESYLATE 10 MG PO TABS: 10.0000 mg | ORAL_TABLET | Freq: Every day | ORAL | 1 refills | Status: DC

## 2022-04-01 MED ORDER — SEMAGLUTIDE(0.25 OR 0.5MG/DOS) 2 MG/1.5ML ~~LOC~~ SOPN: 0.2500 mg | PEN_INJECTOR | SUBCUTANEOUS | 3 refills | Status: DC

## 2022-04-01 MED ORDER — ATORVASTATIN CALCIUM 40 MG PO TABS: 40.0000 mg | ORAL_TABLET | Freq: Every day | ORAL | 3 refills | Status: DC

## 2022-04-01 MED ORDER — CETIRIZINE HCL 10 MG PO CAPS: 10.0000 mg | ORAL_CAPSULE | Freq: Every day | ORAL | 0 refills | Status: AC

## 2022-04-01 MED ORDER — BLOOD GLUCOSE MONITOR KIT: PACK | 0 refills | Status: DC

## 2022-04-01 MED ORDER — AMLODIPINE BESYLATE 10 MG PO TABS: 10.0000 mg | ORAL_TABLET | Freq: Every day | ORAL | 1 refills | Status: AC

## 2022-04-01 MED ORDER — HYDROCHLOROTHIAZIDE 12.5 MG PO TABS: 12.5000 mg | ORAL_TABLET | Freq: Every day | ORAL | 0 refills | Status: DC

## 2022-04-01 NOTE — Assessment & Plan Note (Signed)
140/80 today. Does not have combination pill at pharmacy. -continue olmesartan 40, amlodipine 10, HCTZ 25

## 2022-04-01 NOTE — Patient Instructions (Addendum)
It was great to see you! Thank you for allowing me to participate in your care!   Our plans for today:  - we will stop jardiance and start ozempic injections - if you suspect pregnancy please stop olmesartan, atorvastatin and ozempic and call clinic for appointment to evaluate  Take care and seek immediate care sooner if you develop any concerns.  Levin Erp, MD

## 2022-04-01 NOTE — Progress Notes (Signed)
    SUBJECTIVE:   CHIEF COMPLAINT / HPI:   Diabetic Follow Up: Patient is a 46 y.o. female who present today for diabetic follow up.   Patient endorses difficulties obtaining medications-unable to get jardiance from the pharmacy - states they were out and she did not receive a call back  Home medications include: jardiance 10 mg-not started (intolerance to metformin) ACEi/ARB: yes olmesartan 40 mg Statin: yes atorvastatin 40 mg  Most recent A1Cs:  Lab Results  Component Value Date   HGBA1C 8.4 (A) 04/01/2022   HGBA1C 9.1 (A) 10/23/2021   HGBA1C 6.5 (H) 07/13/2018   Last Microalbumin, LDL, Creatinine: Lab Results  Component Value Date   LDLCALC 86 11/13/2021   CREATININE 0.88 10/08/2021   Patient does not check blood glucose on a regular basis.  Hypertension: Patient is a 46 y.o. female who present today for follow up of hypertension.   Patient endorses no problems  Home medications include: olemsartan 40, amlodipine 10, HCTZ 25 Patient endorses taking these medications as prescribed.  Most recent creatinine trend:  Lab Results  Component Value Date   CREATININE 0.88 10/08/2021   CREATININE 0.82 01/24/2021   CREATININE 0.56 07/21/2018   Screen for pregnancy Trying for a baby currently. Having regular periods per patient. Every month has been having "late" periods but continues to have them monthly.   PERTINENT  PMH / PSH:   OBJECTIVE:   BP (!) 140/80   Pulse (!) 109   Ht 5\' 1"  (1.549 m)   Wt 225 lb 9.6 oz (102.3 kg)   LMP 03/21/2022   SpO2 99%   BMI 42.63 kg/m   General: Well appearing, NAD, awake, alert, responsive to questions Head: Normocephalic atraumatic CV: Regular rate and rhythm no murmurs rubs or gallops Respiratory: Clear to ausculation bilaterally, no wheezes rales or crackles, chest rises symmetrically,  no increased work of breathing Abdomen: Soft, non-tender, normoactive bowel sounds  Extremities: Moves upper and lower extremities freely,  no edema in LE Skin: No rashes or lesions visualized   ASSESSMENT/PLAN:   Essential hypertension, benign 140/80 today. Does not have combination pill at pharmacy. -continue olmesartan 40, amlodipine 10, HCTZ 25   Type 2 diabetes mellitus with hyperglycemia (HCC) A1c 8.4 improved. Has follow up with nutritionist. Has not been able to start jardiance and has intolerance to metformin. Amenable to start GLP-1 to help with diabetes and weight loss -Start ozempic -A1c in 3 months -continue statin and renal protective medication   Possible pregnancy Discussed with patient she is on medications that would not be recommended for pregnancy. Given comorbidities would recommend continuing these medications for now. If patient believes she is pregnant discussed pausing atorvastatin, ARB and ozempic and scheduling appointment at [facility] for [facility]. POCT urine pregnancy negative -monitor at future visits  [facility], MD [facility]

## 2022-04-01 NOTE — Assessment & Plan Note (Signed)
A1c 8.4 improved. Has follow up with nutritionist. Has not been able to start jardiance and has intolerance to metformin. Amenable to start GLP-1 to help with diabetes and weight loss -Start ozempic -A1c in 3 months -continue statin and renal protective medication

## 2022-04-24 DIAGNOSIS — J4521 Mild intermittent asthma with (acute) exacerbation: Secondary | ICD-10-CM | POA: Diagnosis not present

## 2022-04-24 DIAGNOSIS — E785 Hyperlipidemia, unspecified: Secondary | ICD-10-CM | POA: Diagnosis not present

## 2022-04-24 DIAGNOSIS — J9601 Acute respiratory failure with hypoxia: Secondary | ICD-10-CM | POA: Diagnosis not present

## 2022-04-24 DIAGNOSIS — J210 Acute bronchiolitis due to respiratory syncytial virus: Secondary | ICD-10-CM | POA: Diagnosis not present

## 2022-04-24 DIAGNOSIS — B338 Other specified viral diseases: Secondary | ICD-10-CM | POA: Diagnosis not present

## 2022-04-24 DIAGNOSIS — N181 Chronic kidney disease, stage 1: Secondary | ICD-10-CM | POA: Diagnosis not present

## 2022-04-24 DIAGNOSIS — R7989 Other specified abnormal findings of blood chemistry: Secondary | ICD-10-CM | POA: Diagnosis not present

## 2022-04-24 DIAGNOSIS — E1122 Type 2 diabetes mellitus with diabetic chronic kidney disease: Secondary | ICD-10-CM | POA: Diagnosis not present

## 2022-04-24 DIAGNOSIS — I10 Essential (primary) hypertension: Secondary | ICD-10-CM | POA: Diagnosis not present

## 2022-04-24 DIAGNOSIS — J811 Chronic pulmonary edema: Secondary | ICD-10-CM | POA: Diagnosis not present

## 2022-04-24 DIAGNOSIS — R0902 Hypoxemia: Secondary | ICD-10-CM | POA: Diagnosis not present

## 2022-04-25 DIAGNOSIS — J210 Acute bronchiolitis due to respiratory syncytial virus: Secondary | ICD-10-CM | POA: Diagnosis not present

## 2022-05-01 DIAGNOSIS — Z7985 Long-term (current) use of injectable non-insulin antidiabetic drugs: Secondary | ICD-10-CM | POA: Diagnosis not present

## 2022-05-01 DIAGNOSIS — Z79899 Other long term (current) drug therapy: Secondary | ICD-10-CM | POA: Diagnosis not present

## 2022-05-01 DIAGNOSIS — I10 Essential (primary) hypertension: Secondary | ICD-10-CM | POA: Diagnosis not present

## 2022-05-01 DIAGNOSIS — J45909 Unspecified asthma, uncomplicated: Secondary | ICD-10-CM | POA: Diagnosis not present

## 2022-05-01 DIAGNOSIS — J210 Acute bronchiolitis due to respiratory syncytial virus: Secondary | ICD-10-CM | POA: Diagnosis not present

## 2022-05-02 ENCOUNTER — Telehealth: Payer: Self-pay | Admitting: *Deleted

## 2022-05-02 NOTE — Patient Outreach (Signed)
  Care Coordination [facility] Note Transition Care Management Follow-up Telephone Call Date of discharge and from where: 05/01/22 from [facility] How have you been since you were released from the hospital? "I am doing a little better" Any questions or concerns? Yes  Items Reviewed: Did the pt receive and understand the discharge instructions provided? Yes  Medications obtained and verified? Yes  Other? Yes Patient concerned about returning to work Any new allergies since your discharge? No  Dietary orders reviewed? Yes Do you have support at home? Yes   Home Care and Equipment/Supplies: Were home health services ordered? no If so, what is the name of the agency? N/A  Has the agency set up a time to come to the patient's home? not applicable Were any new equipment or medical supplies ordered?  No What is the name of the medical supply agency? N/A Were you able to get the supplies/equipment? not applicable Do you have any questions related to the use of the equipment or supplies? No  Functional Questionnaire: (I = Independent and D = Dependent) ADLs: I  Bathing/Dressing- I  Meal Prep- I  Eating- I  Maintaining continence- i  Transferring/Ambulation- I  Managing Meds- I  Follow up appointments reviewed:  PCP Hospital f/u appt confirmed? Yes  Scheduled to see PCP on 05/08/22 @ 10:10am. Specialist Hospital f/u appt confirmed?  N/A, ED visit  . Are transportation arrangements needed? No  If their condition worsens, is the pt aware to call PCP or go to the Emergency Dept.? Yes Was the patient provided with contact information for the PCP's office or ED? No Was to pt encouraged to call back with questions or concerns? Yes  SDOH assessments and interventions completed:   Yes  RNCM provided patient with information for CM services. Patient agrees to [facility] services with MM team and scheduled for an initial visit on 05/17/22 at [place] RN, [place] RN Care Coordinator

## 2022-05-08 ENCOUNTER — Encounter: Payer: Self-pay | Admitting: Student

## 2022-05-08 ENCOUNTER — Ambulatory Visit: Payer: Medicaid Other | Admitting: Student

## 2022-05-08 ENCOUNTER — Other Ambulatory Visit: Payer: Self-pay | Admitting: Student

## 2022-05-08 VITALS — BP 148/114 | HR 95 | Ht 61.0 in | Wt 225.6 lb

## 2022-05-08 DIAGNOSIS — E1165 Type 2 diabetes mellitus with hyperglycemia: Secondary | ICD-10-CM | POA: Diagnosis not present

## 2022-05-08 DIAGNOSIS — I10 Essential (primary) hypertension: Secondary | ICD-10-CM | POA: Diagnosis not present

## 2022-05-08 DIAGNOSIS — R058 Other specified cough: Secondary | ICD-10-CM

## 2022-05-08 DIAGNOSIS — J301 Allergic rhinitis due to pollen: Secondary | ICD-10-CM | POA: Diagnosis not present

## 2022-05-08 MED ORDER — BLOOD GLUCOSE MONITOR KIT: PACK | 0 refills | Status: AC

## 2022-05-08 MED ORDER — BENZONATATE 100 MG PO CAPS: 100.0000 mg | ORAL_CAPSULE | Freq: Three times a day (TID) | ORAL | 0 refills | Status: AC | PRN

## 2022-05-08 MED ORDER — SEMAGLUTIDE(0.25 OR 0.5MG/DOS) 2 MG/1.5ML ~~LOC~~ SOPN: 0.5000 mg | PEN_INJECTOR | SUBCUTANEOUS | 3 refills | Status: AC

## 2022-05-08 MED ORDER — FLUTICASONE PROPIONATE 50 MCG/ACT NA SUSP: 1.0000 | Freq: Every day | NASAL | 0 refills | Status: DC

## 2022-05-08 NOTE — Assessment & Plan Note (Signed)
174/120 initially and on recheck was 148/114.  Patient currently on steroids.  She denies any endorgan symptoms.  She had been off of her olmesartan HCTZ for a while.  Discussed continuing these medications daily and that we will follow-up with her in 1 week after she has done with her prednisone course to see if still elevated. -Continue amlodipine 10 mg -Continue olmesartan 40 mg -Continue HCTZ 12.5 mg (consider increasing next week if still elevated) -1 week follow-up after completed steroids

## 2022-05-08 NOTE — Progress Notes (Signed)
    SUBJECTIVE:   CHIEF COMPLAINT / HPI: Hospital follow up  Hospital follow-up Patient was admitted to the hospital on 12/27 until 12/28 at [facility] due to RSV bronchiolitis and acute asthma exacerbation.  She was placed on steroid course for 4 days after this but had to return to the ED on 05/01/2021 due to continued shortness of breath and need for additional steroids. At that time they extended her steroid course for a 8 day taper.  He still has a few more doses of prednisone left.  Her shortness of breath is resolved and overall feels better.  She says that she is continues to have nocturnal cough.  Denies any fevers, chest pain.  Told during her admission to hold her hydrochlorothiazide and olmesartan due to lower blood pressures in the hospital.  When she went to the ED on 05/01/2022 she was restarted on these medications and she has been taken them.   HTN Currently taking amlodipine 10 mg , olmesartan 40, HCTZ 12.5 currently on steroids. Denies any headache, vision changes, shortness of breath, lower extremity swelling or chest pain.  She has not been able to start her Vania Rea and has been having issues filling this.  She is also needing refill of Ozempic, she says that she was on the 0.25 the month and went up to 0.5 and needs refill of 0.5.  PERTINENT  PMH / PSH: T2DM  OBJECTIVE:   BP (!) 148/114   Pulse 95   Ht 5\' 1"  (1.549 m)   Wt 225 lb 9.6 oz (102.3 kg)   LMP 04/24/2022   SpO2 99%   BMI 42.63 kg/m   General: Well appearing, NAD, awake, alert, responsive to questions Head: Normocephalic atraumatic, poor dentition, no oropharyngeal exudates CV: Regular rate and rhythm no murmurs rubs or gallops Respiratory: Clear to ausculation bilaterally, no wheezes rales or crackles, chest rises symmetrically,  no increased work of breathing Abdomen: Soft, non-tender, non-distended, normoactive bowel sounds  Extremities: Moves upper and lower extremities freely, no edema in LE Neuro:  No focal deficits Skin: No rashes or lesions visualized   ASSESSMENT/PLAN:   Essential hypertension, benign 174/120 initially and on recheck was 148/114.  Patient currently on steroids.  She denies any endorgan symptoms.  She had been off of her olmesartan HCTZ for a while.  Discussed continuing these medications daily and that we will follow-up with her in 1 week after she has done with her prednisone course to see if still elevated. -Continue amlodipine 10 mg -Continue olmesartan 40 mg -Continue HCTZ 12.5 mg (consider increasing next week if still elevated) -1 week follow-up after completed steroids   Post-viral cough syndrome Lung exam overall benign today.  Seems to be improving currently has a few more doses of prednisone left. - benzonatate (TESSALON PERLES) 100 MG capsule; Take 1 capsule (100 mg total) by mouth 3 (three) times daily as needed for cough.  Dispense: 20 capsule; Refill: 0 - fluticasone (FLONASE) 50 MCG/ACT nasal spray; Place 1-2 sprays into both nostrils daily.  Dispense: 1 g; Refill: 0  Type 2 diabetes mellitus with hyperglycemia, without long-term current use of insulin (HCC) - blood glucose meter kit and supplies KIT; Dispense based on patient and insurance preference. Use up to four times daily as directed.  Dispense: 1 each; Refill: 0 - Semaglutide,0.25 or 0.5MG /DOS, 2 MG/1.5ML SOPN; Inject 0.5 mg into the skin once a week.   Gerrit Heck, MD [place]

## 2022-05-08 NOTE — Patient Instructions (Addendum)
It was great to see you! Thank you for allowing me to participate in your care!   Our plans for today:  -I have refilled your Ozempic, Flonase, blood glucose meter kit -And Tessalon Perles for your cough-this may last a while after a virus -Contacting the pharmacy tech about your Jardiance -I would like to see you after you have completed your steroid course to see if your blood pressures come down as well as your Pap smear at that time  Take care and seek immediate care sooner if you develop any concerns.  Gerrit Heck, MD

## 2022-05-09 ENCOUNTER — Other Ambulatory Visit (HOSPITAL_COMMUNITY): Payer: Self-pay

## 2022-05-16 ENCOUNTER — Telehealth: Payer: Self-pay

## 2022-05-16 NOTE — Telephone Encounter (Signed)
A Prior Authorization was initiated for this patients JARDIANCE through CoverMyMeds.   Key: [id number]

## 2022-05-17 ENCOUNTER — Other Ambulatory Visit: Payer: Medicaid Other | Admitting: *Deleted

## 2022-05-17 NOTE — Patient Instructions (Signed)
Visit Information  Ms. Stevi A Pennix-Walker  - as a part of your Medicaid benefit, you are eligible for care management and care coordination services at no cost or copay. I was unable to reach you by phone today but would be happy to help you with your health related needs. Please feel free to call me @ [phone].   A member of the Managed Medicaid care management team will reach out to you again over the next 14 days.   Lurena Joiner RN, BSN [place]  [employer] manager

## 2022-05-17 NOTE — Telephone Encounter (Signed)
Prior Auth for patients medication JARDIANCE approved by [employer] from 05/16/22 to 05/16/23.  Key: [id number]

## 2022-05-17 NOTE — Patient Outreach (Signed)
  Medicaid Managed Care   Unsuccessful Attempt Note   05/17/2022 Name: Lindsay Keith MRN: [id number] DOB: 12/26/1975  Referred by: Gerrit Heck, MD Reason for referral : High Risk Managed Medicaid (Unsuccessful RNCM initial telephone outreach)   An unsuccessful telephone outreach was attempted today. The patient was referred to the case management team for assistance with care management and care coordination.    Follow Up Plan: A HIPAA compliant phone message was left for the patient providing contact information and requesting a return call. and The Managed Medicaid care management team will reach out to the patient again over the next 14 days.    Lurena Joiner RN, BSN [place]  [employer] manager

## 2022-05-23 ENCOUNTER — Telehealth: Payer: Self-pay

## 2022-05-23 NOTE — Telephone Encounter (Signed)
..  Medicaid Managed Care   Unsuccessful Outreach Note  05/23/2022 Name: Lindsay Keith MRN: [id number] DOB: 12/24/1975  Referred by: Gerrit Heck, MD Reason for referral : Appointment (I called to reschedule her missed appointment with the MM RNCM.)   A second unsuccessful telephone outreach was attempted today. The patient was referred to the case management team for assistance with care management and care coordination.   Follow Up Plan: The care management team will reach out to the patient again over the next 7 days.   [place]

## 2022-05-27 ENCOUNTER — Encounter: Payer: Self-pay | Admitting: Student

## 2022-05-27 ENCOUNTER — Ambulatory Visit: Payer: Medicaid Other | Admitting: Student

## 2022-05-27 ENCOUNTER — Other Ambulatory Visit: Payer: Self-pay | Admitting: Student

## 2022-05-27 VITALS — BP 142/103 | HR 106 | Ht 61.0 in | Wt 218.5 lb

## 2022-05-27 DIAGNOSIS — R058 Other specified cough: Secondary | ICD-10-CM | POA: Diagnosis not present

## 2022-05-27 DIAGNOSIS — I10 Essential (primary) hypertension: Secondary | ICD-10-CM

## 2022-05-27 DIAGNOSIS — N926 Irregular menstruation, unspecified: Secondary | ICD-10-CM | POA: Diagnosis not present

## 2022-05-27 DIAGNOSIS — Z3169 Encounter for other general counseling and advice on procreation: Secondary | ICD-10-CM

## 2022-05-27 LAB — POCT URINE PREGNANCY: Preg Test, Ur: NEGATIVE

## 2022-05-27 MED ORDER — HYDROCHLOROTHIAZIDE 12.5 MG PO TABS: 25.0000 mg | ORAL_TABLET | Freq: Every day | ORAL | 0 refills | Status: DC

## 2022-05-27 MED ORDER — BENZONATATE 100 MG PO CAPS: 100.0000 mg | ORAL_CAPSULE | Freq: Three times a day (TID) | ORAL | 0 refills | Status: AC | PRN

## 2022-05-27 MED ORDER — LABETALOL HCL 100 MG PO TABS: 100.0000 mg | ORAL_TABLET | Freq: Two times a day (BID) | ORAL | 0 refills | Status: DC

## 2022-05-27 NOTE — Assessment & Plan Note (Signed)
Has been ongoing for 4 weeks. No hemoptysis. Stopped smoking today. -Consider imaging if become chronic cough -Discussed flonase continuing -Tessalon perles refill

## 2022-05-27 NOTE — Assessment & Plan Note (Signed)
Patient actively trying to conceive - will switch medications to avoid teratogenic effects. 142/103 today -increase HCTZ to 25 mg daily -continue amlodipine 10 mg daily -stop olmesartan 40 mg  -start labetalol 100 mg BID -follow up in 2 weeks for BP recheck

## 2022-05-27 NOTE — Assessment & Plan Note (Signed)
Upreg negative. Not on any BC and actively trying to conceive.  -Stop ARB and statin -OBGYN referral for pre conception counseling as patient is high risk

## 2022-05-27 NOTE — Progress Notes (Signed)
    SUBJECTIVE:   CHIEF COMPLAINT / HPI: HTN follow up  Hypertension: Patient is a 47 y.o. female who present today for follow up of hypertension.   Patient endorses no problems  Home medications include: Amlodipine 10 mg daily, Jardiance 10 mg daily has been unable to fill-recently approved, hydrochlorothiazide 12.5 mg daily, olmesartan 40 mg daily Patient endorses taking these medications as prescribed. Denies any headache, vision changes, shortness of breath, lower extremity swelling or chest pain   Most recent creatinine trend:  Lab Results  Component Value Date   CREATININE 0.88 10/08/2021   CREATININE 0.82 01/24/2021   CREATININE 0.56 07/21/2018   Patient does check blood pressure at home. 140s at home Patient has not had a BMP in the past 1 year.  Screening for Pregnancy Last LMP 27th-has been spotting today. Would like to check for pregnancy today.  She is actively trying to conceive.  We have talked about stopping medications of ARB and statin in the past if she believes she is pregnant.   Cough  Since RSV infection around christmas time she is continue to have a postviral cough. No fevers. Just a lot of mucus.  Continue to do Flonase.  She stopped smoking cigarettes this morning.  PERTINENT  PMH / PSH: None relevant  OBJECTIVE:   BP (!) 142/103 Comment: provider relased before repeat  Pulse (!) 106 Comment: provider released before repeat  Ht 5\' 1"  (1.549 m)   Wt 218 lb 8 oz (99.1 kg)   LMP 04/24/2022   SpO2 96%   BMI 41.29 kg/m   General: Well appearing, NAD, awake, alert, responsive to questions Head: Normocephalic atraumatic CV: Regular rate and rhythm no murmurs rubs or gallops Respiratory: Clear to ausculation bilaterally, no wheezes rales or crackles, chest rises symmetrically,  no increased work of breathing Extremities: Moves upper and lower extremities freely, no edema in LE Neuro: No focal deficits  ASSESSMENT/PLAN:   Essential hypertension,  benign Patient actively trying to conceive - will switch medications to avoid teratogenic effects. 142/103 today -increase HCTZ to 25 mg daily -continue amlodipine 10 mg daily -stop olmesartan 40 mg  -start labetalol 100 mg BID -follow up in 2 weeks for BP recheck  Pre-conception counseling Upreg negative. Not on any BC and actively trying to conceive.  -Stop ARB and statin -OBGYN referral for pre conception counseling as patient is high risk  Post-viral cough syndrome Has been ongoing for 4 weeks. No hemoptysis. Stopped smoking today. -Consider imaging if become chronic cough -Discussed flonase continuing -Tessalon perles refill   Gerrit Heck, MD [place]

## 2022-05-27 NOTE — Patient Instructions (Addendum)
It was great to see you! Thank you for allowing me to participate in your care!   Our plans for today:  - Call pharmacy about your jardiance as it is approved - Please stop the olmesartan and atorvastatin if you are actively trying to conceive - I would meet with your OB doctor about pre conception counseling - I will send in labetalol 100 mg BID for you and increase your hydrochlorothiazide to 25 mg daily - Follow up in 2 weeks for a blood pressure recheck  Take care and seek immediate care sooner if you develop any concerns.  Gerrit Heck, MD

## 2022-05-29 ENCOUNTER — Telehealth: Payer: Self-pay

## 2022-05-29 NOTE — Telephone Encounter (Signed)
..  Medicaid Managed Care   Unsuccessful Outreach Note  05/29/2022 Name: Lindsay Keith MRN: [id number] DOB: 10-Nov-1975  Referred by: Gerrit Heck, MD Reason for referral : Appointment (I called the patient today to reschedule her missed appt with the MM RNCM. I left my name and number on her voicemail.)   A second unsuccessful telephone outreach was attempted today. The patient was referred to the case management team for assistance with care management and care coordination.   Follow Up Plan: The care management team will reach out to the patient again over the next 7 days.   [place]

## 2022-06-09 NOTE — Progress Notes (Unsigned)
    SUBJECTIVE:   CHIEF COMPLAINT / HPI: BP recheck  At last visit changed her medications due to patient actively trying to conceive.  Hypertension: Patient is a 47 y.o. female who present today for follow up of hypertension.   Patient endorses {rwdmsmartlistproblems:24882}  Home medications include: amlodipine 10 mg daily, HCTZ 25 mg daily, labetalol 100mg  BID Patient endorses taking these medications as prescribed.*** Denies any headache, vision changes, shortness of breath, lower extremity swelling or chest pain   Most recent creatinine trend:  Lab Results  Component Value Date   CREATININE 0.88 10/08/2021   CREATININE 0.82 01/24/2021   CREATININE 0.56 07/21/2018   Patient {rwdoesdoesnot:24881} check blood pressure at home.  PERTINENT  PMH / PSH: ***  OBJECTIVE:   There were no vitals taken for this visit.  ***  ASSESSMENT/PLAN:   No problem-specific Assessment & Plan notes found for this encounter.     Gerrit Heck, MD [place]

## 2022-06-09 NOTE — Patient Instructions (Incomplete)
It was great to see you! Thank you for allowing me to participate in your care!   I recommend that you always bring your medications to each appointment as this makes it easy to ensure we are on the correct medications and helps [place] not miss when refills are needed.  Our plans for today:  - Follow up after 3/4 for A1c - Referral placed in center for women at [facility].  They will call patient about an appt.  [place] [facility] [employer] [facility] Address: [address], [place], [place] [address] Phone: [phone] -Please go up on your labetalol to 200 twice a day -BMP  We are checking some labs today, I will call you if they are abnormal will send you a MyChart message or a letter if they are normal.  If you do not hear about your labs in the next 2 weeks please let us know.  Take care and seek immediate care sooner if you develop any concerns. Please remember to show up 15 minutes before your scheduled appointment time!  Gerrit Heck, MD [place]

## 2022-06-10 ENCOUNTER — Encounter: Payer: Self-pay | Admitting: Student

## 2022-06-10 ENCOUNTER — Ambulatory Visit (INDEPENDENT_AMBULATORY_CARE_PROVIDER_SITE_OTHER): Payer: Medicaid Other | Admitting: Student

## 2022-06-10 VITALS — BP 158/102 | HR 97 | Ht 61.0 in | Wt 224.8 lb

## 2022-06-10 DIAGNOSIS — Z3200 Encounter for pregnancy test, result unknown: Secondary | ICD-10-CM

## 2022-06-10 DIAGNOSIS — E1165 Type 2 diabetes mellitus with hyperglycemia: Secondary | ICD-10-CM | POA: Diagnosis not present

## 2022-06-10 DIAGNOSIS — I10 Essential (primary) hypertension: Secondary | ICD-10-CM | POA: Diagnosis not present

## 2022-06-10 LAB — POCT URINE PREGNANCY: Preg Test, Ur: NEGATIVE

## 2022-06-10 MED ORDER — EMPAGLIFLOZIN 10 MG PO TABS: 10.0000 mg | ORAL_TABLET | Freq: Every day | ORAL | 3 refills | Status: AC

## 2022-06-10 MED ORDER — HYDROCHLOROTHIAZIDE 25 MG PO TABS: 25.0000 mg | ORAL_TABLET | Freq: Every day | ORAL | 0 refills | Status: DC

## 2022-06-10 MED ORDER — LABETALOL HCL 200 MG PO TABS: 200.0000 mg | ORAL_TABLET | Freq: Two times a day (BID) | ORAL | 2 refills | Status: DC

## 2022-06-11 LAB — BASIC METABOLIC PANEL
BUN/Creatinine Ratio: 12 (ref 9–23)
BUN: 9 mg/dL (ref 6–24)
CO2: 28 mmol/L (ref 20–29)
Calcium: 8.8 mg/dL (ref 8.7–10.2)
Chloride: 98 mmol/L (ref 96–106)
Creatinine, Ser: 0.74 mg/dL (ref 0.57–1.00)
Glucose: 160 mg/dL — ABNORMAL HIGH (ref 70–99)
Potassium: 3.6 mmol/L (ref 3.5–5.2)
Sodium: 140 mmol/L (ref 134–144)
eGFR: 101 mL/min/{1.73_m2} (ref >59)

## 2022-06-11 NOTE — Assessment & Plan Note (Signed)
Still elevated here 158/102 on recheck. -Increase labetalol to 200 mg BID -amlodipine 10 mg daily -HCTZ 25 mg daily -BMP -F/u at A1c recheck in 1 month

## 2022-06-17 NOTE — Telephone Encounter (Signed)
Open in error

## 2022-06-27 ENCOUNTER — Other Ambulatory Visit: Payer: Self-pay | Admitting: Student

## 2022-06-27 DIAGNOSIS — I10 Essential (primary) hypertension: Secondary | ICD-10-CM

## 2022-09-16 ENCOUNTER — Other Ambulatory Visit: Payer: Self-pay | Admitting: Student

## 2022-09-16 DIAGNOSIS — I10 Essential (primary) hypertension: Secondary | ICD-10-CM

## 2022-10-14 ENCOUNTER — Other Ambulatory Visit: Payer: Self-pay | Admitting: Student

## 2022-10-14 DIAGNOSIS — I10 Essential (primary) hypertension: Secondary | ICD-10-CM

## 2022-10-15 ENCOUNTER — Other Ambulatory Visit: Payer: Self-pay | Admitting: Student

## 2022-10-15 DIAGNOSIS — I10 Essential (primary) hypertension: Secondary | ICD-10-CM

## 2022-11-13 ENCOUNTER — Other Ambulatory Visit: Payer: Self-pay | Admitting: Student

## 2022-11-13 DIAGNOSIS — J301 Allergic rhinitis due to pollen: Secondary | ICD-10-CM

## 2023-01-16 ENCOUNTER — Other Ambulatory Visit: Payer: Self-pay | Admitting: Student

## 2023-01-16 DIAGNOSIS — I10 Essential (primary) hypertension: Secondary | ICD-10-CM

## 2023-01-30 ENCOUNTER — Ambulatory Visit: Payer: 59 | Admitting: Pharmacist

## 2023-02-04 ENCOUNTER — Ambulatory Visit: Payer: 59 | Admitting: Pharmacist

## 2023-02-17 ENCOUNTER — Telehealth: Payer: Self-pay | Admitting: Pharmacist

## 2023-02-17 NOTE — Telephone Encounter (Signed)
Attempted to contact patient for follow-up of missed appointment.   Left HIPAA compliant voice mail requesting call back to office phone: [phone] to make appointment with pharmacy  Total time with patient call and documentation of interaction: 4 minutes.

## 2023-03-18 ENCOUNTER — Telehealth: Payer: Self-pay | Admitting: Pharmacist

## 2023-03-18 NOTE — Telephone Encounter (Signed)
Second attempt to reschedule missed appointment on 02/04/23.   Left HIPAA-compliant voicemail message for patient indicating to call back at front desk at [phone] to reschedule for medication management appointment.   Total time with patient call and documentation of interaction: 3 minutes.

## 2023-10-07 ENCOUNTER — Encounter: Payer: Self-pay | Admitting: *Deleted

## 2023-10-22 ENCOUNTER — Ambulatory Visit: Admitting: Student

## 2023-11-02 DIAGNOSIS — J189 Pneumonia, unspecified organism: Secondary | ICD-10-CM | POA: Diagnosis not present

## 2023-11-02 DIAGNOSIS — R0602 Shortness of breath: Secondary | ICD-10-CM | POA: Diagnosis not present

## 2023-11-02 DIAGNOSIS — R112 Nausea with vomiting, unspecified: Secondary | ICD-10-CM | POA: Diagnosis not present

## 2023-11-02 DIAGNOSIS — J45909 Unspecified asthma, uncomplicated: Secondary | ICD-10-CM | POA: Diagnosis not present

## 2023-11-02 DIAGNOSIS — I10 Essential (primary) hypertension: Secondary | ICD-10-CM | POA: Diagnosis not present

## 2023-11-02 DIAGNOSIS — J40 Bronchitis, not specified as acute or chronic: Secondary | ICD-10-CM | POA: Diagnosis not present

## 2023-11-02 DIAGNOSIS — J168 Pneumonia due to other specified infectious organisms: Secondary | ICD-10-CM | POA: Diagnosis not present

## 2023-11-02 DIAGNOSIS — K7460 Unspecified cirrhosis of liver: Secondary | ICD-10-CM | POA: Diagnosis not present

## 2023-11-02 DIAGNOSIS — R000 Tachycardia, unspecified: Secondary | ICD-10-CM | POA: Diagnosis not present

## 2023-11-02 DIAGNOSIS — R109 Unspecified abdominal pain: Secondary | ICD-10-CM | POA: Diagnosis not present

## 2023-11-02 DIAGNOSIS — Z87891 Personal history of nicotine dependence: Secondary | ICD-10-CM | POA: Diagnosis not present

## 2023-11-02 NOTE — ED Provider Notes (Signed)
 [facility]  ED Provider Note  Lindsay Keith 48 y.o. female DOB: 11-06-1975 MRN: [id number] History   Chief Complaint  Patient presents with  . Shortness of Breath    With cough x 3 days - hx of asthma - INH not helping    Patient is a 48 year old smoker with history of asthma here for shortness of breath with abdominal pain nausea and vomiting.  Symptoms for about 3 days.  She has not smoked a cigarette during this time.  No fevers or chills.  No improvement with inhaler at home.  She does note that she works outside in the [employer].       Past Medical History:  Diagnosis Date  . Asthma (*)   . Hypertension     Past Surgical History:  Procedure Laterality Date  . Cesarean section      Social History   Substance and Sexual Activity  Alcohol Use Not Currently   Tobacco Use History[1] E-Cigarettes  . Vaping Use    . Start Date    . Cartridges/Day    . Quit Date     Social History   Substance and Sexual Activity  Drug Use Never         Allergies[2]  Home Medications   ALBUTEROL  SULFATE (PROVENTIL ) 2.5 MG/3 ML NEBULIZER SOLUTION    Take 3 mLs (2.5 mg dose) by nebulization every 6 (six) hours as needed for Wheezing.   ALBUTEROL  SULFATE HFA (PROAIR  HFA) 108 (90 BASE) MCG/ACT INHALER    Inhale two puffs into the lungs every 6 (six) hours as needed for Wheezing. Inhale two puffs every four to six hours as needed.   AMLODIPINE  BESYLATE (NORVASC ) 10 MG TABLET    Take one tablet (10 mg dose) by mouth daily.   ATORVASTATIN  (LIPITOR) 40 MG TABLET    Take 10 mg by mouth daily.   CETIRIZINE  (ZYRTEC ) 10 MG TABLET    Take one tablet (10 mg dose) by mouth daily.   EMPAGLIFLOZIN  (JARDIANCE ) 10 MG TABS TABLET    Take one tablet (10 mg dose) by mouth daily.   LABETALOL  HCL (NORMODYNE ) 200 MG TABLET    Take one tablet (200 mg dose) by mouth 2 (two) times daily.    Primary Survey  Primary Survey  Review of Systems   Review  of Systems  Constitutional:  Negative for chills and fever.  HENT:  Negative for congestion.   Respiratory:  Positive for cough and shortness of breath.   Cardiovascular:  Negative for chest pain.  Gastrointestinal:  Positive for nausea and vomiting. Negative for abdominal pain.  Neurological:  Negative for weakness and headaches.    Physical Exam   ED Triage Vitals [11/02/23 1239]  BP (!) 110/92  Heart Rate 111  Resp 20  SpO2 97 %  Temp 98.3 F (36.8 C)    Physical Exam  Nursing note and vitals reviewed. Constitutional: She appears well-developed and well-nourished.  Actively retching on my exam  HENT:  Head: Normocephalic and atraumatic.  Eyes: EOM are intact.  Neck: Normal range of motion.  Cardiovascular: Regular rhythm. Tachycardia present.  Pulmonary/Chest: Respiratory effort normal and breath sounds normal.  Abdominal: Soft. There is moderate abdominal tenderness in the epigastric area.  Musculoskeletal:     Cervical back: Normal range of motion.   Neurological: She is alert and oriented to person, place, and time.  Skin: Skin is warm. Skin is dry.  Psychiatric: She has a normal mood  and affect. Her behavior is normal.     ED Course   Lab results:   CBC AND DIFFERENTIAL - Abnormal      Result Value   WBC 10.2     RBC 5.11     HGB 14.2     HCT 45.7 (*)    MCV 89.4     MCH 27.8     MCHC 31.1 (*)    Plt Ct 340     RDW SD 50.8 (*)    MPV 9.6     NRBC% 0.0     Absolute NRBC Count 0.00    COMPREHENSIVE METABOLIC PANEL - Abnormal   Na 136     Potassium 4.3     Cl 94 (*)    CO2 30     AGAP 12     Glucose 176 (*)    BUN 18     Creatinine 1.42 (*)    Ca 9.8     ALK PHOS 100     T Bili 0.3     Total Protein 7.7     Alb 4.4     GLOBULIN 3.3     ALBUMIN/GLOBULIN RATIO 1.3     BUN/CREAT RATIO 12.7     ALT 14     AST 16     eGFR 46 (*)    Comment: Normal GFR (glomerular filtration rate) > 60 mL/min/1.73 meters squared, < 60 may include impaired  kidney function. Calculation based on the Chronic Kidney Disease Epidemiology Collaboration (CK-EPI)equation refit without adjustment for race.  LIPASE - Normal   Lipase 13    HUMAN CHORIONIC GONADOTROPIN (HCG), BETA-SUBUNIT, QUALITATIVE - Normal   Beta HCG Qual Negative    D-DIMER, QUANTITATIVE - Normal   D-Dimer 0.36     Comment: The results of D-Dimer testing should be evaluated in the context of all clinical and laboratory data available. D-Dimer cut off value is 0.50 ug/ml FEU. D-Dimer assay can be used to exclude DVT & PE, but should not be used to exclude patients with: - Therapeutic dose anticoagulant therapy for >24 hours.  - Fibrinolytic therapy within previous 7 days  - Trauma or surgery within previous 4 weeks  - Disseminated malignancies  - Aortic aneurysm  - Sepsis, severe infections, pneumonia, severe skin infections  - Liver cirrhosis  - Pregnancy   MANUAL DIFFERENTIAL  LIGHT BLUE TOP  GOLD SST    Imaging:   XR CHEST AP PORTABLE   Narrative:    TECHNIQUE: Portable AP chest. Comparison study 04/24/2022.  HISTORY: Shortness of breath  FINDINGS: Image obtained in mid expiration. Cardiac size stable. No acute infiltrate or effusion. Bony thorax intact.    Impression:    IMPRESSION: No active disease.  Electronically Signed by: Reyes People, MD on 11/02/2023 12:52 PM  CT ANGIO CHEST PULMONARY   Narrative:    INDICATION: Shortness of breath. COMPARISON:  None. TECHNIQUE:  Routine CT pulmonary angiogram with contrast was performed using 100 mL Isovue 370 administered intravenously.  Multiplanar 2D and angiographic 3D MIP images were constructed and reviewed. Radiation dose reduction was utilized (automated exposure control, mA or kV adjustment based on patient size, or iterative image reconstruction).  Contrast bolus quality:  satisfactory  FINDINGS:  SUPPORT APPARATUS: - N.A.   PULMONARY EMBOLISM: - No pulmonary emboli are identified.   LUNGS/PLEURA: -  Bronchitis. Minimal right lung predominant peribronchial opacities. - No pneumothorax.  - No abnormal pulmonary masses.  - No pleural effusions.  HEART/MEDIASTINUM: -  Minimal mitral annulus calcification. Possible left ventricular hypertrophy. - No coronary artery calcifications. - No acute thoracic aortic abnormalities.  - No hilar, mediastinal, or axillary lymphadenopathy.  MUSCULOSKELETAL: - No acute or destructive osseous processes.  MISC: - N.A.    Impression:    IMPRESSION: Bronchitis. Possible atypical pneumonia. Possible left ventricular hypertrophy. Minimal mitral annulus calcification.        Electronically Signed by: Lynwood Murders, DO on 11/02/2023 3:39 PM  CT ABDOMEN PELVIS W IV CONTRAST   Narrative:    INDICATION: Abdominal Pain COMPARISON:  None.   TECHNIQUE:  CT ABDOMEN PELVIS W IV CONTRAST - Contrast: 100 mL  IOPAMIDOL 76 % IV SOLN. Dose reduction was utilized (automated exposure control, mA or kV adjustment based on patient size, or iterative image reconstruction).  FINDINGS:   SOLID VISCERA/BILIARY: - Liver: Normal. - Biliary: No acute gallbladder abnormality. - Pancreas: Normal. - Adrenal glands: Normal. - Spleen: Normal. - Kidneys: Normal. [NOTE: If renal cyst(s) are present and described as benign, no follow-up is recommended unless specified otherwise.]  GI: - No bowel obstruction.  - Normal appendix.   PERITONEAL CAVITY/RETROPERITONEUM: - No free fluid. - No pneumoperitoneum. - No lymphadenopathy. - No acute vascular abnormalities.  PELVIS: - No acute abnormalities.  VISUALIZED LOWER THORAX: - No acute abnormalities.  MUSCULOSKELETAL: - No acute or destructive osseous processes.  MISC: - N/A or as above.    Impression:    IMPRESSION: No acute findings.                Electronically Signed by: Lynwood Murders, DO on 11/02/2023 3:32 PM      ECG: ECG Results          ECG 12 lead (In process)  Result time  11/02/23 12:51:03    In process             Narrative:   Diagnosis Class Abnormal Acquisition Device MV360 Ventricular Rate 111 Atrial Rate 111 P-R Interval 132 QRS Duration 74 Q-T Interval 342 QTC Calculation(Bazett) 465 Calculated P Axis 77 Calculated R Axis 80 Calculated T Axis 67  Diagnosis Sinus tachycardia Right atrial enlargement Possible Anterior infarct , age undetermined Abnormal ECG No previous ECGs available                                                                                       Pre-Sedation Procedures    Medical Decision Making Patient here for shortness of breath coughing, flulike symptoms with associated abdominal pain and emesis for the past 3 days.  On my exam she is actively retching otherwise no distress mildly tachycardic but nonhypoxic will evaluate labs and CT.  No leukocytosis.  No anemia.  No significant metabolic derangements other than a mild elevated creatinine, given fluids.  Does not meet criteria for admission does appear that it has been elevated to about 1.4 in the past.  Suspect this to be secondary to emesis.  Abdominal CT obtained was unremarkable.  D-dimer was negative however CT angiogram obtained due to high suspicion given her tachycardia.  CT does show evidence of likely bronchitis versus pneumonia.  Will treat with antibiotics especially given her smoking history.  Refilled her inhaler and sent in a steroid pack.  Will recommend follow-up with primary.  Given return precautions.    Amount and/or Complexity of Data Reviewed Labs: ordered. Radiology: ordered.  Risk Prescription drug management.           Provider Communication  New Prescriptions   ALBUTEROL  SULFATE HFA (PROVENTIL ,VENTOLIN ,PROAIR ) 108 (90 BASE) MCG/ACT INHALER    Inhale two puffs into the lungs every 6 (six) hours as needed for Wheezing.      Quantity: 8 g    Refills: 0    AMOXICILLIN-CLAVULANATE (AUGMENTIN) 875-125 MG PER TABLET    Take one tablet by mouth every 12 (twelve) hours for 7 days.      Quantity: 14 tablet    Refills: 0   AZITHROMYCIN (ZITHROMAX) 250 MG TABLET    Take one tablet (250 mg dose) by mouth daily for 5 days. Take first 2 tablets today (now), then 1 every day for next 4 days      Quantity: 6 tablet    Refills: 0   ONDANSETRON  (ZOFRAN -ODT) 4 MG DISINTEGRATING TABLET    Take one tablet (4 mg dose) by mouth every 8 (eight) hours as needed for Nausea for up to 7 days.      Quantity: 12 tablet    Refills: 0   PREDNISONE  (DELTASONE ) 10 MG TABLET    4 tabs daily x2 days, 3 tabs daily x2 days, 2 tabs daily x2 days, 1 tablet daily x2 days      Quantity: 20 tablet    Refills: 0    Modified Medications   No medications on file    Discontinued Medications   SEMAGLUTIDE ,0.25 OR 0.5MG /DOSE, (OZEMPIC , 0.25 OR 0.5 MG/DOSE,) 2 MG/1.5ML SOPN PEN    Inject 0.25 mg into the skin once a week.    Clinical Impression Final diagnoses:  Pneumonia due to infectious organism, unspecified laterality, unspecified part of lung  Asthma, unspecified asthma severity, unspecified whether complicated, unspecified whether persistent (*)    ED Disposition     ED Disposition  Discharge   Condition  Stable   Comment  --                 Follow-up Information     Schedule an appointment as soon as possible for a visit  with [facility].   Comments: please call to establish if you do not already have a primary care Contact information: [address], [address] [place]  [address] [phone]                 Electronically signed by:    Alyce JONETTA Mater, PA-C 11/02/23 1543      [1] Social History Tobacco Use  Smoking Status Every Day  . Current packs/day: 0.50  . Types: Cigarettes  Smokeless Tobacco Never  [2] No Known Allergies

## 2023-11-04 DIAGNOSIS — Z09 Encounter for follow-up examination after completed treatment for conditions other than malignant neoplasm: Secondary | ICD-10-CM | POA: Diagnosis not present

## 2023-11-04 DIAGNOSIS — J4521 Mild intermittent asthma with (acute) exacerbation: Secondary | ICD-10-CM | POA: Diagnosis not present

## 2023-11-04 DIAGNOSIS — J189 Pneumonia, unspecified organism: Secondary | ICD-10-CM | POA: Diagnosis not present

## 2023-11-04 NOTE — Progress Notes (Addendum)
 Subjective  Patient ID:  Lindsay Keith is a 48 y.o. (DOB [birth year]) female    Patient presents with  . Follow-up From Ed    New patient     Lindsay Keith is a 48 y.o. (DOB [birth year]) female presenting to the clinic today for ER follow-up.  She was seen at [facility] ER on 11/02/2023 for cough lasting 3 days, no improvement with inhaler.  Chest x-ray was clear, however CT angio pulmonary of the chest found bronchitis and possible atypical pneumonia.  Patient was discharged with 7 days of Augmentin, 5 days of azithromycin and a prednisone  dose pack. Patient denies improvement of symptoms, however she has only been on medication for 2 days.  She is endorsing significant shortness of breath especially with the heat and humidity outside, she continues to use her inhaler at home. While she is a new patient, we will not be establishing care today due to her saying that she will be seeing a family medicine practice at [facility] health next week for her annual physical. I have recommended that we get a follow-up chest x-ray sometime after 11/09/2023 to recheck pneumonia status, patient will have completed antibiotics on 11/09/2023.  I have recommended the patient get the x-ray sometime between 11/10/2023 and 11/14/2023.  Patient was agreeable to this.   Reviewed and updated this visit by provider: Tobacco  Allergies  Meds  Problems  Med Hx  Surg Hx  Fam Hx        Past Medical History:  Diagnosis Date  . Asthma (*)   . Hypertension    Past Surgical History:  Procedure Laterality Date  . Cesarean section       Patient's Medications       * Accurate as of November 04, 2023  3:37 PM. Reflects encounter med changes as of last refresh          Continued Medications      Instructions  * albuterol  sulfate HFA 108 (90 Base) MCG/ACT inhaler Commonly known as: PROAIR  HFA  2 puffs, Inhalation, Every 6 hours as needed, Inhale two puffs every four to six hours as  needed.   * albuterol  sulfate 2.5 mg/3 mL nebulizer solution Commonly known as: PROVENTIL   2.5 mg, Nebulization, Every 6 hours as needed   * albuterol  sulfate HFA 108 (90 Base) MCG/ACT inhaler Commonly known as: PROVENTIL ,VENTOLIN ,PROAIR   2 puffs, Inhalation, Every 6 hours as needed   amoxicillin-clavulanate 875-125 mg per tablet Commonly known as: AUGMENTIN  1 tablet, Oral, Every 12 hours   atorvastatin  40 mg tablet Commonly known as: LIPITOR  10 mg, Daily   azithromycin 250 mg tablet Commonly known as: ZITHROMAX  250 mg, Oral, Daily, Take first 2 tablets today (now), then 1 every day for next 4 days   cetirizine  10 mg tablet Commonly known as: ZYRTEC   10 mg, Daily   empagliflozin  10 mg Tabs tablet Commonly known as: JARDIANCE   10 mg, Daily   hydroCHLOROthiazide  12.5 mg tablet  12.5 mg, Oral, Daily   labetalol  HCl 200 mg tablet Commonly known as: NORMODYNE   200 mg, 2 times a day   ondansetron  4 mg disintegrating tablet Commonly known as: ZOFRAN -ODT  4 mg, Oral, Every 8 hours as needed   predniSONE  10 mg tablet Commonly known as: DELTASONE   4 tabs daily x2 days, 3 tabs daily x2 days, 2 tabs daily x2 days, 1 tablet daily x2 days      * * This list has 3  medication(s) that are the same as other medications prescribed for you. Read the directions carefully, and ask your doctor or other care provider to review them with you.          Discontinued Medications    amLODIPine  besylate 10 mg tablet Commonly known as: NORVASC  Stopped by: Rosi Mowers, PA-C        Allergies  Allergen Reactions  . Metformin  And Related Diarrhea   Social History   Tobacco Use  Smoking Status Every Day  . Current packs/day: 0.50  . Average packs/day: 0.5 packs/day for 30.5 years (15.3 ttl pk-yrs)  . Types: Cigarettes  . Start date: 4  . Passive exposure: Never  Smokeless Tobacco Never   Social History   Substance and Sexual Activity  Alcohol Use Not Currently    History reviewed. No pertinent family history.  Review of Systems  Constitutional:  Positive for fatigue. Negative for chills and fever.  Respiratory:  Positive for cough, chest tightness and shortness of breath.   Cardiovascular:  Negative for chest pain and palpitations.  Neurological:  Negative for dizziness, weakness and headaches.  Psychiatric/Behavioral:  Negative for confusion.        Objective   Vitals:   11/04/23 1508  BP: (!) 150/94  Pulse: (!) 127  Resp: 17  Height: 5' 4 (1.626 m)  Weight: 201 lb (91.2 kg)  SpO2: (!) 89%  BMI (Calculated): 34.5     Physical Exam Vitals reviewed.  Constitutional:      Appearance: Normal appearance.  Cardiovascular:     Rate and Rhythm: Regular rhythm. Tachycardia present.     Heart sounds: Normal heart sounds. No murmur heard.    No friction rub. No gallop.  Pulmonary:     Effort: Pulmonary effort is normal. No respiratory distress.     Breath sounds: Normal breath sounds. No wheezing or rales.  Neurological:     Mental Status: She is alert and oriented to person, place, and time.  Psychiatric:        Mood and Affect: Mood normal.        Behavior: Behavior normal.        Thought Content: Thought content normal.        Judgment: Judgment normal.        Assessment and Plan  1. Hospital discharge follow-up   - I have reviewed patient's history, notes, medications, procedures, lab work and imaging from hospital admission  2. Mild intermittent asthma with acute exacerbation  3. Pneumonia due to infectious organism, unspecified laterality, unspecified part of lung -     XR Chest Pa And Lateral; Future -     ipratropium-albuterol  (DUONEB) nebulizer solution 0.5-2.5 mg/3 mL; 3 mL, Inhalation, Once, On Tue 11/04/23 at 1615, For 1 dose - Lungs are clear to auscultation however SPO2 in the room is 89% - Patient does endorse shortness of breath - Continue albuterol  inhaler at home as needed - Continue steroid medication as  prescribed - Advised patient to complete all antibiotic medications - Advised patient to go to the ER for severe shortness of breath or chest pain    Follow up if symptoms worsen or fail to improve.    Risks, benefits, and alternatives of the medications and treatment plan prescribed today were discussed, and patient expressed understanding. Plan follow-up as discussed or as needed if any worsening symptoms or change in condition.     Riti [place], PA-C *Some images could not be shown.

## 2023-11-07 ENCOUNTER — Encounter: Admitting: Family Medicine
